# Patient Record
Sex: Female | Born: 1960 | Race: White | Hispanic: No | Marital: Single | Smoking: Never smoker
Health system: Southern US, Community
[De-identification: ages and names within clinical notes are randomized; demographics above are authoritative.]

## PROBLEM LIST (undated history)

## (undated) DIAGNOSIS — M51369 Other intervertebral disc degeneration, lumbar region without mention of lumbar back pain or lower extremity pain: Secondary | ICD-10-CM

## (undated) DIAGNOSIS — D649 Anemia, unspecified: Secondary | ICD-10-CM

## (undated) DIAGNOSIS — E079 Disorder of thyroid, unspecified: Secondary | ICD-10-CM

## (undated) DIAGNOSIS — R102 Pelvic and perineal pain unspecified side: Secondary | ICD-10-CM

## (undated) DIAGNOSIS — R319 Hematuria, unspecified: Secondary | ICD-10-CM

## (undated) DIAGNOSIS — M5126 Other intervertebral disc displacement, lumbar region: Secondary | ICD-10-CM

## (undated) DIAGNOSIS — K219 Gastro-esophageal reflux disease without esophagitis: Secondary | ICD-10-CM

## (undated) DIAGNOSIS — K660 Peritoneal adhesions (postprocedural) (postinfection): Secondary | ICD-10-CM

## (undated) DIAGNOSIS — M5136 Other intervertebral disc degeneration, lumbar region: Secondary | ICD-10-CM

## (undated) DIAGNOSIS — G4730 Sleep apnea, unspecified: Secondary | ICD-10-CM

## (undated) DIAGNOSIS — N809 Endometriosis, unspecified: Secondary | ICD-10-CM

## (undated) DIAGNOSIS — I10 Essential (primary) hypertension: Secondary | ICD-10-CM

## (undated) DIAGNOSIS — W3400XA Accidental discharge from unspecified firearms or gun, initial encounter: Secondary | ICD-10-CM

## (undated) DIAGNOSIS — Y249XXA Unspecified firearm discharge, undetermined intent, initial encounter: Secondary | ICD-10-CM

## (undated) DIAGNOSIS — N200 Calculus of kidney: Secondary | ICD-10-CM

## (undated) HISTORY — DX: Gastro-esophageal reflux disease without esophagitis: K21.9

## (undated) HISTORY — PX: EXPLORATORY LAPAROTOMY: SUR591

## (undated) HISTORY — DX: Calculus of kidney: N20.0

## (undated) HISTORY — DX: Endometriosis, unspecified: N80.9

## (undated) HISTORY — DX: Sleep apnea, unspecified: G47.30

## (undated) HISTORY — DX: Hematuria, unspecified: R31.9

## (undated) HISTORY — DX: Other intervertebral disc degeneration, lumbar region without mention of lumbar back pain or lower extremity pain: M51.369

## (undated) HISTORY — DX: Accidental discharge from unspecified firearms or gun, initial encounter: W34.00XA

## (undated) HISTORY — PX: JOINT REPLACEMENT: SHX530

## (undated) HISTORY — DX: Pelvic and perineal pain: R10.2

## (undated) HISTORY — DX: Peritoneal adhesions (postprocedural) (postinfection): K66.0

## (undated) HISTORY — DX: Other intervertebral disc degeneration, lumbar region: M51.36

## (undated) HISTORY — DX: Other intervertebral disc displacement, lumbar region: M51.26

## (undated) HISTORY — DX: Pelvic and perineal pain unspecified side: R10.20

## (undated) HISTORY — PX: CARPAL TUNNEL RELEASE: SHX101

## (undated) HISTORY — DX: Unspecified firearm discharge, undetermined intent, initial encounter: Y24.9XXA

---

## 2006-09-10 ENCOUNTER — Ambulatory Visit: Payer: Self-pay

## 2006-09-17 ENCOUNTER — Ambulatory Visit: Payer: Self-pay

## 2007-07-20 ENCOUNTER — Ambulatory Visit: Payer: Self-pay | Admitting: Family Medicine

## 2007-10-28 ENCOUNTER — Ambulatory Visit: Payer: Self-pay | Admitting: Family Medicine

## 2008-01-07 ENCOUNTER — Ambulatory Visit: Payer: Self-pay | Admitting: Family Medicine

## 2008-02-25 ENCOUNTER — Ambulatory Visit: Payer: Self-pay | Admitting: Family Medicine

## 2008-03-03 ENCOUNTER — Ambulatory Visit: Payer: Self-pay | Admitting: Family Medicine

## 2008-04-23 ENCOUNTER — Encounter: Admission: RE | Admit: 2008-04-23 | Discharge: 2008-04-23 | Payer: Self-pay | Admitting: Neurology

## 2009-05-02 ENCOUNTER — Ambulatory Visit: Payer: Self-pay | Admitting: Family Medicine

## 2009-05-28 ENCOUNTER — Emergency Department: Payer: Self-pay | Admitting: Emergency Medicine

## 2009-06-13 ENCOUNTER — Emergency Department: Payer: Self-pay | Admitting: Emergency Medicine

## 2009-06-15 ENCOUNTER — Ambulatory Visit: Payer: Self-pay | Admitting: Orthopedic Surgery

## 2009-06-16 ENCOUNTER — Ambulatory Visit: Payer: Self-pay | Admitting: Orthopedic Surgery

## 2009-08-22 ENCOUNTER — Encounter: Payer: Self-pay | Admitting: Orthopedic Surgery

## 2009-09-06 ENCOUNTER — Encounter: Payer: Self-pay | Admitting: Orthopedic Surgery

## 2009-10-07 ENCOUNTER — Encounter: Payer: Self-pay | Admitting: Orthopedic Surgery

## 2009-11-23 ENCOUNTER — Ambulatory Visit: Payer: Self-pay | Admitting: Family Medicine

## 2009-12-18 ENCOUNTER — Ambulatory Visit: Payer: Self-pay | Admitting: Orthopedic Surgery

## 2009-12-21 ENCOUNTER — Ambulatory Visit: Payer: Self-pay | Admitting: Orthopedic Surgery

## 2010-01-23 ENCOUNTER — Encounter: Payer: Self-pay | Admitting: Physician Assistant

## 2010-02-04 ENCOUNTER — Encounter: Payer: Self-pay | Admitting: Physician Assistant

## 2010-03-07 ENCOUNTER — Encounter: Payer: Self-pay | Admitting: Physician Assistant

## 2010-03-16 ENCOUNTER — Ambulatory Visit: Payer: Self-pay | Admitting: Specialist

## 2010-04-27 ENCOUNTER — Ambulatory Visit: Payer: Self-pay | Admitting: Endocrinology

## 2010-08-20 ENCOUNTER — Ambulatory Visit: Payer: Self-pay | Admitting: Family Medicine

## 2011-06-06 DIAGNOSIS — E010 Iodine-deficiency related diffuse (endemic) goiter: Secondary | ICD-10-CM | POA: Insufficient documentation

## 2011-08-06 DIAGNOSIS — G43719 Chronic migraine without aura, intractable, without status migrainosus: Secondary | ICD-10-CM | POA: Insufficient documentation

## 2011-10-17 ENCOUNTER — Ambulatory Visit: Payer: Self-pay | Admitting: Family Medicine

## 2012-06-16 DIAGNOSIS — G5600 Carpal tunnel syndrome, unspecified upper limb: Secondary | ICD-10-CM | POA: Insufficient documentation

## 2012-06-23 DIAGNOSIS — M1991 Primary osteoarthritis, unspecified site: Secondary | ICD-10-CM | POA: Insufficient documentation

## 2012-10-07 HISTORY — PX: OTHER SURGICAL HISTORY: SHX169

## 2012-12-31 DIAGNOSIS — I10 Essential (primary) hypertension: Secondary | ICD-10-CM | POA: Insufficient documentation

## 2012-12-31 DIAGNOSIS — J309 Allergic rhinitis, unspecified: Secondary | ICD-10-CM | POA: Insufficient documentation

## 2012-12-31 DIAGNOSIS — E039 Hypothyroidism, unspecified: Secondary | ICD-10-CM | POA: Insufficient documentation

## 2013-01-21 DIAGNOSIS — G4733 Obstructive sleep apnea (adult) (pediatric): Secondary | ICD-10-CM | POA: Insufficient documentation

## 2013-01-28 ENCOUNTER — Ambulatory Visit: Payer: Self-pay | Admitting: Family Medicine

## 2013-02-18 ENCOUNTER — Ambulatory Visit: Payer: Self-pay | Admitting: Family Medicine

## 2013-02-23 ENCOUNTER — Ambulatory Visit: Payer: Self-pay | Admitting: Emergency Medicine

## 2013-03-02 ENCOUNTER — Emergency Department: Payer: Self-pay | Admitting: Emergency Medicine

## 2013-03-03 DIAGNOSIS — M129 Arthropathy, unspecified: Secondary | ICD-10-CM | POA: Insufficient documentation

## 2013-03-17 ENCOUNTER — Ambulatory Visit: Payer: Self-pay | Admitting: Orthopedic Surgery

## 2013-04-05 ENCOUNTER — Ambulatory Visit: Payer: Self-pay | Admitting: Orthopedic Surgery

## 2013-04-05 LAB — CBC
HCT: 39.1 % (ref 35.0–47.0)
MCV: 89 fL (ref 80–100)
Platelet: 317 10*3/uL (ref 150–440)
RBC: 4.41 10*6/uL (ref 3.80–5.20)

## 2013-04-05 LAB — URINALYSIS, COMPLETE
Glucose,UR: NEGATIVE mg/dL (ref 0–75)
Ketone: NEGATIVE
Leukocyte Esterase: NEGATIVE

## 2013-04-05 LAB — BASIC METABOLIC PANEL
Anion Gap: 5 — ABNORMAL LOW (ref 7–16)
BUN: 12 mg/dL (ref 7–18)
Chloride: 105 mmol/L (ref 98–107)
Osmolality: 277 (ref 275–301)
Potassium: 4 mmol/L (ref 3.5–5.1)

## 2013-04-05 LAB — APTT: Activated PTT: 31.9 secs (ref 23.6–35.9)

## 2013-04-20 ENCOUNTER — Inpatient Hospital Stay: Payer: Self-pay | Admitting: Orthopedic Surgery

## 2013-04-21 LAB — HEMOGLOBIN: HGB: 11.9 g/dL — ABNORMAL LOW (ref 12.0–16.0)

## 2013-04-21 LAB — PLATELET COUNT: Platelet: 252 10*3/uL (ref 150–440)

## 2013-04-21 LAB — BASIC METABOLIC PANEL
Chloride: 109 mmol/L — ABNORMAL HIGH (ref 98–107)
Co2: 27 mmol/L (ref 21–32)
EGFR (African American): >60
Potassium: 3.6 mmol/L (ref 3.5–5.1)
Sodium: 141 mmol/L (ref 136–145)

## 2013-04-22 LAB — PATHOLOGY REPORT

## 2013-05-13 ENCOUNTER — Ambulatory Visit: Payer: Self-pay | Admitting: Orthopedic Surgery

## 2013-05-19 ENCOUNTER — Ambulatory Visit: Payer: Self-pay | Admitting: Orthopedic Surgery

## 2013-06-23 ENCOUNTER — Ambulatory Visit: Payer: Self-pay | Admitting: Family Medicine

## 2013-06-25 DIAGNOSIS — N951 Menopausal and female climacteric states: Secondary | ICD-10-CM | POA: Insufficient documentation

## 2013-07-15 DIAGNOSIS — N200 Calculus of kidney: Secondary | ICD-10-CM | POA: Insufficient documentation

## 2013-07-15 DIAGNOSIS — N3020 Other chronic cystitis without hematuria: Secondary | ICD-10-CM | POA: Insufficient documentation

## 2013-07-15 DIAGNOSIS — R3989 Other symptoms and signs involving the genitourinary system: Secondary | ICD-10-CM | POA: Insufficient documentation

## 2013-07-15 DIAGNOSIS — R339 Retention of urine, unspecified: Secondary | ICD-10-CM | POA: Insufficient documentation

## 2013-07-26 DIAGNOSIS — N3941 Urge incontinence: Secondary | ICD-10-CM | POA: Insufficient documentation

## 2013-08-06 ENCOUNTER — Ambulatory Visit: Payer: Self-pay | Admitting: Urology

## 2013-10-26 ENCOUNTER — Ambulatory Visit: Payer: Self-pay | Admitting: Obstetrics and Gynecology

## 2013-10-26 LAB — CBC
HCT: 39.6 % (ref 35.0–47.0)
HGB: 13.2 g/dL (ref 12.0–16.0)
MCH: 30.5 pg (ref 26.0–34.0)
MCHC: 33.4 g/dL (ref 32.0–36.0)
MCV: 91 fL (ref 80–100)
Platelet: 307 10*3/uL (ref 150–440)
RBC: 4.34 10*6/uL (ref 3.80–5.20)
RDW: 13.4 % (ref 11.5–14.5)
WBC: 7 10*3/uL (ref 3.6–11.0)

## 2013-10-26 LAB — BASIC METABOLIC PANEL
Anion Gap: 6 — ABNORMAL LOW (ref 7–16)
BUN: 15 mg/dL (ref 7–18)
CALCIUM: 9 mg/dL (ref 8.5–10.1)
CREATININE: 0.52 mg/dL — AB (ref 0.60–1.30)
Chloride: 105 mmol/L (ref 98–107)
Co2: 29 mmol/L (ref 21–32)
EGFR (African American): >60
EGFR (Non-African Amer.): >60
Glucose: 78 mg/dL (ref 65–99)
Osmolality: 279 (ref 275–301)
POTASSIUM: 4.2 mmol/L (ref 3.5–5.1)
SODIUM: 140 mmol/L (ref 136–145)

## 2013-11-01 ENCOUNTER — Ambulatory Visit: Payer: Self-pay | Admitting: Obstetrics and Gynecology

## 2013-11-03 LAB — PATHOLOGY REPORT

## 2013-11-26 DIAGNOSIS — N809 Endometriosis, unspecified: Secondary | ICD-10-CM | POA: Insufficient documentation

## 2014-01-14 ENCOUNTER — Ambulatory Visit: Payer: Self-pay | Admitting: Urology

## 2014-01-17 DIAGNOSIS — N23 Unspecified renal colic: Secondary | ICD-10-CM | POA: Insufficient documentation

## 2014-01-20 DIAGNOSIS — M549 Dorsalgia, unspecified: Secondary | ICD-10-CM | POA: Insufficient documentation

## 2014-01-24 DIAGNOSIS — G5139 Clonic hemifacial spasm, unspecified: Secondary | ICD-10-CM | POA: Insufficient documentation

## 2014-04-12 DIAGNOSIS — N952 Postmenopausal atrophic vaginitis: Secondary | ICD-10-CM | POA: Insufficient documentation

## 2014-05-27 ENCOUNTER — Ambulatory Visit: Payer: Self-pay | Admitting: Obstetrics and Gynecology

## 2014-05-31 ENCOUNTER — Ambulatory Visit: Payer: Self-pay | Admitting: Obstetrics and Gynecology

## 2014-05-31 LAB — BASIC METABOLIC PANEL
ANION GAP: 8 (ref 7–16)
BUN: 14 mg/dL (ref 7–18)
CO2: 29 mmol/L (ref 21–32)
Calcium, Total: 8.9 mg/dL (ref 8.5–10.1)
Chloride: 105 mmol/L (ref 98–107)
Creatinine: 0.61 mg/dL (ref 0.60–1.30)
GLUCOSE: 81 mg/dL (ref 65–99)
OSMOLALITY: 283 (ref 275–301)
POTASSIUM: 4.5 mmol/L (ref 3.5–5.1)
Sodium: 142 mmol/L (ref 136–145)

## 2014-05-31 LAB — CBC
HCT: 40.2 % (ref 35.0–47.0)
HGB: 13.6 g/dL (ref 12.0–16.0)
MCH: 31.8 pg (ref 26.0–34.0)
MCHC: 33.7 g/dL (ref 32.0–36.0)
MCV: 94 fL (ref 80–100)
Platelet: 291 10*3/uL (ref 150–440)
RBC: 4.27 10*6/uL (ref 3.80–5.20)
RDW: 12.8 % (ref 11.5–14.5)
WBC: 8.1 10*3/uL (ref 3.6–11.0)

## 2014-06-06 ENCOUNTER — Inpatient Hospital Stay: Payer: Self-pay | Admitting: Obstetrics and Gynecology

## 2014-06-06 HISTORY — PX: ABDOMINAL HYSTERECTOMY: SHX81

## 2014-06-07 LAB — HEMOGLOBIN: HGB: 11.2 g/dL — AB (ref 12.0–16.0)

## 2014-06-08 LAB — PATHOLOGY REPORT

## 2014-07-21 ENCOUNTER — Ambulatory Visit: Payer: Self-pay | Admitting: Family Medicine

## 2014-09-26 ENCOUNTER — Ambulatory Visit: Payer: Self-pay | Admitting: Orthopedic Surgery

## 2014-10-06 ENCOUNTER — Ambulatory Visit: Payer: Self-pay | Admitting: Orthopedic Surgery

## 2014-10-06 DIAGNOSIS — I10 Essential (primary) hypertension: Secondary | ICD-10-CM

## 2014-10-06 LAB — URINALYSIS, COMPLETE
BILIRUBIN, UR: NEGATIVE
BLOOD: NEGATIVE
GLUCOSE, UR: NEGATIVE mg/dL (ref 0–75)
KETONE: NEGATIVE
LEUKOCYTE ESTERASE: NEGATIVE
NITRITE: NEGATIVE
PH: 6 (ref 4.5–8.0)
Protein: NEGATIVE
RBC,UR: NONE SEEN /HPF (ref 0–5)
Specific Gravity: 1.008 (ref 1.003–1.030)
Squamous Epithelial: 1

## 2014-10-06 LAB — BASIC METABOLIC PANEL
ANION GAP: 3 — AB (ref 7–16)
BUN: 16 mg/dL (ref 7–18)
CHLORIDE: 106 mmol/L (ref 98–107)
Calcium, Total: 8.5 mg/dL (ref 8.5–10.1)
Co2: 30 mmol/L (ref 21–32)
Creatinine: 0.66 mg/dL (ref 0.60–1.30)
EGFR (Non-African Amer.): >60
GLUCOSE: 76 mg/dL (ref 65–99)
Osmolality: 277 (ref 275–301)
Potassium: 4.2 mmol/L (ref 3.5–5.1)
SODIUM: 139 mmol/L (ref 136–145)

## 2014-10-06 LAB — CBC
HCT: 39.5 % (ref 35.0–47.0)
HGB: 13 g/dL (ref 12.0–16.0)
MCH: 30.9 pg (ref 26.0–34.0)
MCHC: 32.8 g/dL (ref 32.0–36.0)
MCV: 94 fL (ref 80–100)
Platelet: 280 10*3/uL (ref 150–440)
RBC: 4.2 10*6/uL (ref 3.80–5.20)
RDW: 13.1 % (ref 11.5–14.5)
WBC: 6.1 10*3/uL (ref 3.6–11.0)

## 2014-10-06 LAB — SEDIMENTATION RATE: Erythrocyte Sed Rate: 15 mm/hr (ref 0–30)

## 2014-10-06 LAB — APTT: Activated PTT: 29.2 secs (ref 23.6–35.9)

## 2014-10-06 LAB — PROTIME-INR
INR: 1
Prothrombin Time: 12.8 secs (ref 11.5–14.7)

## 2014-10-06 LAB — MRSA PCR SCREENING

## 2014-10-11 ENCOUNTER — Inpatient Hospital Stay: Payer: Self-pay | Admitting: Orthopedic Surgery

## 2014-10-12 LAB — BASIC METABOLIC PANEL
ANION GAP: 6 — AB (ref 7–16)
BUN: 10 mg/dL (ref 7–18)
CREATININE: 0.73 mg/dL (ref 0.60–1.30)
Calcium, Total: 7.1 mg/dL — ABNORMAL LOW (ref 8.5–10.1)
Chloride: 105 mmol/L (ref 98–107)
Co2: 28 mmol/L (ref 21–32)
EGFR (Non-African Amer.): >60
GLUCOSE: 100 mg/dL — AB (ref 65–99)
OSMOLALITY: 277 (ref 275–301)
Potassium: 3.3 mmol/L — ABNORMAL LOW (ref 3.5–5.1)
SODIUM: 139 mmol/L (ref 136–145)

## 2014-10-12 LAB — HEMOGLOBIN: HGB: 10.3 g/dL — AB (ref 12.0–16.0)

## 2014-10-12 LAB — PLATELET COUNT: PLATELETS: 236 10*3/uL (ref 150–440)

## 2014-10-13 LAB — BASIC METABOLIC PANEL
Anion Gap: 7 (ref 7–16)
BUN: 6 mg/dL — ABNORMAL LOW (ref 7–18)
CHLORIDE: 103 mmol/L (ref 98–107)
CO2: 28 mmol/L (ref 21–32)
CREATININE: 0.61 mg/dL (ref 0.60–1.30)
Calcium, Total: 7.7 mg/dL — ABNORMAL LOW (ref 8.5–10.1)
EGFR (African American): >60
EGFR (Non-African Amer.): >60
Glucose: 90 mg/dL (ref 65–99)
Osmolality: 273 (ref 275–301)
POTASSIUM: 3.3 mmol/L — AB (ref 3.5–5.1)
SODIUM: 138 mmol/L (ref 136–145)

## 2014-10-13 LAB — HEMOGLOBIN: HGB: 10.1 g/dL — AB (ref 12.0–16.0)

## 2014-10-14 LAB — POTASSIUM: POTASSIUM: 4 mmol/L (ref 3.5–5.1)

## 2015-01-27 NOTE — Op Note (Signed)
PATIENT NAME:  Vanessa Miller, Vanessa Miller MR#:  [id number] DATE OF BIRTH:  [birth year]  DATE OF PROCEDURE:  05/19/2013  PREOPERATIVE DIAGNOSIS:  Right knee arthrofibrosis.  POSTOPERATIVE DIAGNOSIS:  Right knee arthrofibrosis.   PROCEDURE:  Right knee manipulation.   ANESTHESIA:  General.   SURGEON:  Laurene Footman, M.D.   DESCRIPTION OF PROCEDURE:  The patient is brought to the Operating Room and after adequate anesthesia was obtained, appropriate patient identification and timeout procedures were completed.  After this was completed, the hip was brought up into flexion and passive flexion at the start of the case was approximately 80 degrees.  Gentle pressure was applied to the proximal tibia and palpable and audible popping of adhesions was noted.  The knee was brought to approximately 120 degrees flexion and full extension.  The patient was then sent to the recovery room in stable condition.  There are no complications.  No specimen.  No blood loss.     ____________________________ Laurene Footman, MD mjm:ea D: 05/19/2013 18:54:02 ET T: 05/20/2013 03:22:21 ET JOB#: [id number]  cc: Laurene Footman, MD, <Dictator> Laurene Footman MD ELECTRONICALLY SIGNED 05/20/2013 7:19

## 2015-01-27 NOTE — Discharge Summary (Signed)
PATIENT NAME:  Vanessa Miller, Vanessa Miller MR#:  [id number] DATE OF BIRTH:  07-02-1961  DATE OF ADMISSION:  04/20/2013 DATE OF DISCHARGE:  04/23/2013  ADMITTING DIAGNOSIS: Right knee osteoarthritis.   DISCHARGE DIAGNOSIS: Right knee osteoarthritis.   PROCEDURE: Right total knee replacement.   SURGEON: Laurene Footman, MD   ASSISTANT: Rachelle Hora, PA-C   ANESTHESIA: Spinal.   TOTAL TOURNIQUET TIME: 86 minutes.   ESTIMATED BLOOD LOSS: 100 mL.   COMPLICATIONS: None.   SPECIMEN: Cut ends of bone.   IMPLANTS: Medacta GMK primary total knee system, size 3 narrow right femur standard, a 2 right fixed tibial component with a 2 17-mm standard insert and a size 1 patella. All components were cemented.   HISTORY:  The patient is a 54 year old who has had a long history of right knee pain.   She has persistent pain.  She has had prior injections bilaterally.  She is currently taking narcotic pain medication, and this does not give adequate relief. She has pain with activity as well as with her rest. She has been using a cane and still has significant pain.  She had a recent right knee CT.     PHYSICAL EXAMINATION:  RIGHT KNEE: She has 0 to 150 degrees with a correctable varus deformity.  She has medial joint and patellofemoral crepitation with mild swelling to the knee.  A small Baker's cyst is noted. Distally she has intact pulses and sensation that is intact. HEENT: Normal. HEART:  Regular rate and rhythm. LUNGS:  Clear to auscultation.   HOSPITAL COURSE: The patient was admitted to the hospital on 04/20/2013. She had surgery that same day and was brought to the orthopedic floor from the PACU unit in stable condition. On postoperative day 1, the patient had slow progress with physical therapy.  She had acute postop blood loss anemia. Her vital signs are stable. On postoperative day 2, the patient had progressed well with physical therapy.  She was doing well with her pain control. Her vital signs  are stable. On postoperative day 3, the patient was doing well, and she was ready for discharge to home with home health physical therapy.   CONDITION AT DISCHARGE: Stable.   DISCHARGE INSTRUCTIONS: 1.  The patient may gradually increase weight-bearing on the affected extremity.  2.  She is to elevate it, put her leg on 1 or 2 pillows with foot higher than the knee.  3.  She is to wear thigh-high TED hose on both legs and remove at bedtime, replace on arising the next morning.   4. use using incentive spirometry every hour while awake and encouraged cough and deep breathing.  5.  She may resume a regular diet as tolerated.  6.  Continue using Polar Care unit, maintaining temperature between 40 to 50 degrees.  7.  She is to not get the dressing or bandage wet or dirty. Call [facility], if the dressing gets water under it leaving the dressing on. Call [facility] if you experience any increased redness, swelling or drainage at the incision, fever above 101.5 degrees. 8.  Call [facility] if there is pain, nausea, weakness, malaise  or bowel or bladder symptoms.  9.  Home physical therapy has been arranged for continuation of the rehab program. She is to call [facility] if the therapist has not contacted her within 48 hours of her return home. She is to follow up with [place] in 2 weeks.   DISCHARGE MEDICATIONS: Accupril 10 mg  1 tablet orally once a day, Armour thyroid 90 mg 1 tablet orally once a day, loratadine 10 mg 1 cap orally once a day, omeprazole 40 mg 1 cap orally once a day, cranberry fruit oral capsule 1 tablet orally once a day, Centrum Silver therapeutic multiple vitamins 1 tablet orally once a day, Tylenol 500 mg 1 tablet orally every 4 hours as needed for pain or temp greater than 101.4, oxycodone 5 mg 1 to 2 tabs orally every 4 hours as needed for pain, Nucynta 100 mg 1 tablet orally every 4 hours as needed for pain, magnesium hydroxide 8% oral suspension 30 mL orally 2 times a day as needed  for constipation, Xarelto 10 mg oral tablet 1 tablet orally once a day in the morning x 9 days. Bisacodyl 10 mg rectal suppository, 1 suppository per rectum once a day as needed for constipation. Docusate senna 50 mg/8.6 mg oral tablet, 1 tablet orally 2 times a day.   ____________________________ T. Rachelle Hora, PA-C tcg:cb D: 04/22/2013 21:36:00 ET T: 04/22/2013 22:42:33 ET JOB#: [id number]  cc: T. Rachelle Hora, PA-C, <Dictator> Duanne Guess [place] ELECTRONICALLY SIGNED 05/10/2013 12:28

## 2015-01-27 NOTE — Op Note (Signed)
PATIENT NAME:  Vanessa Miller, Vanessa Miller MR#:  [id number] DATE OF BIRTH:  07/09/1961  DATE OF PROCEDURE:  04/20/2013  PREOPERATIVE DIAGNOSIS: Right knee osteoarthritis.   POSTOPERATIVE DIAGNOSIS: Right knee osteoarthritis.   PROCEDURE: Right total knee replacement.   SURGEON: Laurene Footman, MD   ASSISTANT:  Rachelle Hora, PA-C  ANESTHESIA: Spinal.   DESCRIPTION OF PROCEDURE: The patient was brought to the operating room and after adequate spinal anesthesia was obtained, the patient was placed supine on the operative table with Alvarado legholder utilized with a tourniquet applied to the upper thigh After prepping and draping in the usual sterile fashion and appropriate patient identification and timeout procedures were completed. The leg was exsanguinated with an Esmarch and the tourniquet raised after appropriate patient identification and timeout procedures were completed. A midline skin incision with the knee in flexion was made followed by medial parapatellar arthrotomy. Inspection of the knee revealed severe medial compartment arthrosis with exposed bone over most of the medial femoral condyle and tibial condyle. There was extensive wear of the patella and relative sparing of the lateral compartment. The ACL and fat pad were excised. The proximal tibia was exposed to allow for application of the Freeland cutting guide. The cutting guide was applied and the proximal tibia cut carried out preserving the PCL. The femur was approached in a similar fashion with the Loraine cutting guide applied and distal femoral cut made. The femur was a size 3 and the anterior, posterior and chamfer cuts carried out. With the residual horns of the menisci excised at this time, the tibial trial was placed with a proximal drill hole and rotation based off the pin left from the tibial cutting block and a 3 femur. It took a 17 mm spacer to get excellent stability in flexion and mid flexion with significant instability in flexion  until that thickness was obtained. Full extension was also obtained. The patella was cut using the patellar guide and sized to a size 1 after drill holes were made. At this point, all the bony surfaces were thoroughly irrigated, after all the trials had been removed. The tourniquet was let down. Hemostasis was checked with electrocautery. Exparel diluted with saline was infiltrated in the periarticular tissue with a combination of Sensorcaine, morphine and Toradol to aid in postop analgesia. The tourniquet was then again raised and the bony surfaces were irrigated and dried. The tibial component was cemented into place first with excess cement removed followed by placement of the polyethylene component. The femoral component was then placed and patellar button clamped into place. After thorough irrigation of the knee, after the cement had set, the arthrotomy was repaired using #1 Ethibond at the medial aspect to help maintain alignment and the rest of the arthrotomy closed with heavy quill, 2-0 quill subcutaneously and skin staples followed by insertion of wound VAC and Polar Care device. Tourniquet was let down prior to the close of the case.   TOTAL TOURNIQUET TIME:  86 minutes.   ESTIMATED BLOOD LOSS: 100 mL.   COMPLICATIONS: None.   SPECIMEN: Cut ends of bone.   IMPLANTS: Medacta GMK primary total knee system, size 3 narrow right femur standard, a 2 right fixed tibial component with a 2 17 mm standard insert and a size 1 patella. All components cemented.   ____________________________ Laurene Footman, MD mjm:sb D: 04/20/2013 10:32:49 ET T: 04/20/2013 11:08:50 ET JOB#: [id number]  cc: Laurene Footman, MD, <Dictator> Laurene Footman MD ELECTRONICALLY SIGNED 04/20/2013 14:21
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
## 2015-01-28 NOTE — Op Note (Signed)
PATIENT NAME:  Vanessa Miller, Vanessa Miller MR#:  [id number] DATE OF BIRTH:  [birth year]  DATE OF PROCEDURE:  06/06/2014  PREOPERATIVE DIAGNOSES:  1.  Symptomatic endometriosis.  2.  Chronic pelvic pain.  3.  Pelvic adhesive disease.   POSTOPERATIVE DIAGNOSES:  1.  Symptomatic endometriosis.  2.  Chronic pelvic pain.  3.  Pelvic adhesive disease.   PROCEDURE:  Total abdominal hysterectomy, bilateral salpingo-oophorectomy.   SURGEON:  Malachi Paradise, MD   FIRST ASSISTANT: Rubie Maid, MD   ANESTHESIA: General endotracheal.   INDICATIONS: The patient is a 54 year old white female with history of endometriosis, symptomatic, who presents for definitive surgery through TAH, BSO.  Depo-Lupron therapy did not resolve symptomatology.  Previous laparoscopy demonstrated endometriosis overlying the left infundibulopelvic ligament.   FINDINGS AT SURGERY:  Small uterus; normal right tube and ovary; left ovary and tube were stuck down to the left pelvic sidewall.   DESCRIPTION OF PROCEDURE: The patient was brought to the Operating Room where she was placed in the supine position.  General endotracheal anesthesia was induced without difficulty.  A ChloraPrep abdominal, perineal, intravaginal prep and drape was performed in standard fashion.  Foley catheter was placed and was draining clear yellow urine.  After timeout procedure was performed in standard fashion, Pfannenstiel incision was made in the abdomen. Fascia was incised transversely and extended bilaterally with Mayo scissors. The midline raphe was identified, separated and the peritoneum was entered.  O'Connor-O'Sullivan retractor was placed to help facilitate exposure.  The bowel was packed off with wet laps.  Bladder blade was placed.  Uterus was grasped with a double-tooth tenaculum.  The above-noted findings were made with significant adhesions between the left ovary and left pelvic sidewall.  The round ligaments were doubly clamped, cut, and stick  tied using 0 Vicryl suture.  The anterior and posterior leafs of the broad ligament were opened.  The infundibulopelvic ligament was isolated away from the ureter bilaterally and the structure on the right was doubly clamped and cut.  A free tie and stick tie were placed to facilitate hemostasis. The bladder flap was created over lower uterine segment through sharp dissection.  On the left side, the ovary was stuck down to the pelvic sidewall and decision was made to remove the ovary after hysterectomy was performed. The utero-ovarian ligament was doubly clamped and cut and stick tied with 0 Vicryl suture.  The anterior and posterior leafs of the broad ligament were adequately opened to skeletonize the uterine vessels.  The uterine vessels were isolated bilaterally and doubly clamped and cut.  These were stick tied with 0 Vicryl suture.  Sequentially, the cardinal broad ligament complexes were taken down through a clamping, cutting, and stick tying technique down to the level of the cervicovaginal junction.  At this point, the curved Heaney clamps were used to crossclamp the distal part of the cervix and excise the uterus and right tube and ovary from the operative field. The angles of the vagina were closed using 0 Vicryl sutures. The intervening cuff was closed with figure-of-eight sutures of 0 Vicryl.  Good hemostasis was noted.  Following the hysterectomy, the attention was turned to the left adnexa where the adhesions were encountered. The ovary was dissected free from the pelvic sidewall through sharp dissection. This structure along with the infundibulopelvic ligaments were isolated away from the ureter.  The infundibulopelvic ligament was then doubly clamped, cut, and stick tied using 0 Vicryl suture. Good hemostasis was noted.  All tissue was sent to pathology  for analysis. The pelvis was then copiously irrigated with saline and the irrigant fluid was aspirated.  All pedicles were noted to be hemostatic.   The abdomen was then closed in layers using 0 Maxon on the fascia in a simple running manner.  The subcutaneous tissues were reapproximated using 2-0 Vicryl suture.  The skin was closed with a subcuticular stitch of 4-0 Vicryl.  Dermabond glue was placed over the incision.  A Lidoderm patch was likewise placed for analgesia purposes and a pressure dressing was applied.  An x-ray had to be done because of a lost needle during the procedure.  A needle popped off the needle holder and could not be found.  X-ray was negative for retained objects.   This needle was later identified on a stool in the operating room.  The patient was then awakened, extubated and taken to the recovery room in satisfactory condition.   ESTIMATED BLOOD LOSS: 75 mL.  All instruments, needle, and sponge counts were verified as correct.  The patient did receive Ancef antibiotic prophylaxis.      ____________________________ Alanda Slim Mckenlee Mangham, MD mad:DT D: 06/06/2014 10:08:29 ET T: 06/06/2014 10:31:12 ET JOB#: [id number]  cc: Hassell Done A. Teale Goodgame, MD, <Dictator> [facility] MD ELECTRONICALLY SIGNED 06/16/2014 8:07

## 2015-02-05 NOTE — Op Note (Signed)
PATIENT NAME:  Vanessa Miller, Vanessa Miller MR#:  [id number] DATE OF BIRTH:  1961-09-24  DATE OF PROCEDURE:  10/11/2014  PREOPERATIVE DIAGNOSIS: Loose right total knee components.   POSTOPERATIVE DIAGNOSIS:  Loose right total knee components.   PROCEDURE: Revision of tibial and femoral components, right knee.   SURGEON: Laurene Footman, MD.  ASSISTANT:  Ronney Asters, PA-C.    ANESTHESIA: Spinal.   DESCRIPTION OF PROCEDURE: The patient was brought to the Operating Room and after adequate anesthesia was obtained, the right leg was prepped and draped in the usual sterile fashion with a tourniquet applied to the upper thigh. After prepping and draping in the usual sterile manner, appropriate patient identification and timeout procedures were completed.   The prior incision was utilized with the midline skin incision followed by a medial parapatellar arthrotomy. Inspection revealed moderate synovitis. After initial synovectomy had been completed to give exposure of the ends of the implants, the prior polyethylene implant was removed and there was some wear present, but nothing significant.   Next, a small osteotome was used to separate the metal from the cement on the femoral component and the femoral component was removed without difficulty and without bone loss. This was also true on the tibial side. Next, the cement removal equipment was used to remove the plug and cement from the canal of the tibia.  At this point, the hand reaming of the tibia was carried out up to a size 11 and a proximal tibial cut carried out. The tibia was sized to a size 2 and pinned in place.  Proximal reaming carried out followed by keel punch with a 10 mm augment.  This implant was left in place with a 10 mm rod as the 11 mm tended to tip the component into malalignment.  With tibial side done, the distal femur was opened, sequentially hand reamed and a distal femoral cut carried out, resecting approximately 4 mm getting fresh  bone. The distal femur sized to a size 3.  Anterior and posterior chamfer cuts were made, followed by the box for the PS implants.  With the PS cut completed. Trial components were placed with a 14 mm 105 femoral stem.  With these in place, a 12 mm insert gave excellent stability in mid flexion and extension with normal patellofemoral tracking.  The patella was examined at this time and it was appropriately fixed with no evidence of significant wear consistent with preoperative findings of loosening of the femoral and tibial components, but not of the patella.    At this point, the trial components were all removed and the tourniquet raised. The bony surfaces thoroughly irrigated and dried. Exparel in a dilute solution was infiltrated around the knee to aid in postoperative analgesia. When the cement was ready, this was packed into the proximal tibia. The tibial component was cemented into place first. The femoral component was then placed followed by the tibial trial. The knee was held in extension until the cement had set and then excess cement was removed. The polyethylene implant was then placed.  The initial polyethylene did not lock and so was discarded. A second implant snapped into place and fit well with set screw tightened with the torque wrench. The tourniquet was let down and the knee thoroughly irrigated. The arthrotomy was repaired using 0 Ethibond along the patella and then a heavy Quill suture, 2-0 Quill subcutaneously and skin staples. Xeroform, 4 x 4's, ABD's, Webril and Ace wrap applied with a drain placed  prior to closure with a lateral Hemovac.  Polar Care device and Ace then applied. The patient was sent to the recovery room in stable condition.   ESTIMATED BLOOD LOSS: 300 mL.   COMPLICATIONS: None.   SPECIMEN: None.   TOURNIQUET TIME: 56 minutes at 300 mmHg.    IMPLANTS: Medacta GMK revision system with a size 3 right PS femoral component, 14 mm x 105 mm stem with 4 mm distal  augments, a size 2 right fixed tibial implant with medial and lateral 10 mm wedges and a 10 x 105 mm length stem extension with gentamicin bone cement being utilized.    ____________________________ Laurene Footman, MD mjm:by D: 10/11/2014 17:46:22 ET T: 10/11/2014 22:11:52 ET JOB#: [id number]  cc: Laurene Footman, MD, <Dictator> Laurene Footman MD ELECTRONICALLY SIGNED 10/12/2014 8:29

## 2015-02-05 NOTE — Discharge Summary (Signed)
PATIENT NAME:  Vanessa, Miller MR#:  [id number] DATE OF BIRTH:  1960/12/28  DATE OF ADMISSION:  10/11/2014 DATE OF DISCHARGE:  10/14/2014  ADMITTING DIAGNOSIS: Loose right total knee components.  DISCHARGE DIAGNOSES: Loose right total knee components.   PROCEDURE: Revision of tibial and femoral components, right knee.   SURGEON: Laurene Footman, M.D.   ASSISTANT: Ronney Asters, PA-C.   ANESTHESIA: Spinal.   IMPLANTS: Medacta GMK revision system with a size 3 right PS femoral component, 14 mm x 105 mm stem with 4 mm distal augments, a size 2 right fixed tibial implant with medial and lateral 10 mm wedges, and a 10 x 105 mm length stem extension with gentamicin bone cement being utilized.   HISTORY: The patient is a 54 year old female who is seen for follow-up for right total knee complaint of pain. The original procedure was performed in 2014. She did not have any wound healing problems. She did have a pain-free period after the replacement. Symptoms started several months ago. Symptoms were gradual in onset. She reports no new injury or accident. She reports 5/10 pain. The pain is located diffuse and medial. The pain is described as aching. The symptoms are aggravated with normal daily activities, bending and using stairs. She has no mechanical symptoms. She has associated swelling. Bone scan was consistent with loosening of femoral and tibial components.   PHYSICAL EXAMINATION: GENERAL: Well-developed, well-nourished female in no apparent distress. Normal affect. Antalgic gait.  LUNGS: Clear to auscultation.  HEART: Regular rate and rhythm.  HEENT: Normal.  RIGHT LOWER EXTREMITY: Mild swelling with mild effusion with no warmth or erythema. The incision site was healed. She has tenderness along the medial joint line and range of motion was -5 to 115 degrees. Strength was normal. Alignment was good. There was good stability. NEUROLOGICAL: Normal.   HOSPITAL COURSE: The patient was  admitted to the hospital on October 11, 2014. She had surgery that same day and was brought to the orthopedic floor from the PACU in stable condition. On October 12, 2014, postop day 1, the patient was doing well. Pain was moderate. Potassium was down to 3.3 and hemoglobin was down to 10.3. The patient was given Klor-Con potassium supplementation. Vital signs were stable. On postop day 2, October 13, 2014, potassium was stable at 3.3 and hemoglobin dropped minimally at 10.1. The patient was doing well. Vital signs were stable. She was asymptomatic. Pain has improved significantly. Tramadol had been added to her pain regimen. On postop day 3, October 14, 2014, potassium was up to 4. Vital signs were stable. Pain was well controlled and she had made significant progress with physical therapy. The patient did have a bowel movement, and she was stable and ready for discharge to home with home health PT.  DISCHARGE INSTRUCTIONS: The patient can increase weight-bearing on the effected extremity. Elevate the effected foot or leg on 1 or 2 pillows with the foot higher than the knee. Thigh-high TED hose on both legs and remove 1 hour per 8 hour shift. Elevate the heels off the bed. Use incentive spirometer every hour while awake. Encourage cough and deep breathing. The patient may resume a regular diet as tolerated. Continue using Polar Care unit maintaining a temp between 40 and 50 degrees. Do not get the dressing or bandage wet or dirty. Call [facility] ortho if the dressing gets water under it. Leave the dressing on. Call [facility] ortho if any of the following occur: Bright red bleeding  from the incision wound, fever above 101.5 degrees, redness, swelling, or drainage at the incision. Call [facility] ortho if you experience any increased leg pain, numbness or weakness in your legs or bowel or bladder symptoms. Home physical therapy has been arranged for continuation of rehab program. Please call [place] if a therapist has not contacted you within 48 hours of your return home. Please follow up with [facility] ortho in 2 weeks.   DISCHARGE MEDICATIONS: Please see list of discharge medications on discharge instructions.  ____________________________ T. Rachelle Hora, PA-C tcg:sb D: 10/14/2014 08:07:31 ET T: 10/14/2014 10:40:08 ET JOB#: [id number]  cc: T. Rachelle Hora, PA-C, <Dictator> Duanne Guess [place] ELECTRONICALLY SIGNED 10/18/2014 8:31

## 2015-04-02 ENCOUNTER — Encounter: Payer: Self-pay | Admitting: Medical Oncology

## 2015-04-02 ENCOUNTER — Emergency Department
Admission: EM | Admit: 2015-04-02 | Discharge: 2015-04-02 | Disposition: A | Discharge status: Home / Self Care | Payer: No Typology Code available for payment source | Attending: Emergency Medicine | Admitting: Emergency Medicine

## 2015-04-02 DIAGNOSIS — Y9289 Other specified places as the place of occurrence of the external cause: Secondary | ICD-10-CM | POA: Insufficient documentation

## 2015-04-02 DIAGNOSIS — R51 Headache: Secondary | ICD-10-CM | POA: Diagnosis not present

## 2015-04-02 DIAGNOSIS — Y9389 Activity, other specified: Secondary | ICD-10-CM | POA: Insufficient documentation

## 2015-04-02 DIAGNOSIS — T148 Other injury of unspecified body region: Secondary | ICD-10-CM | POA: Insufficient documentation

## 2015-04-02 DIAGNOSIS — Y998 Other external cause status: Secondary | ICD-10-CM | POA: Diagnosis not present

## 2015-04-02 DIAGNOSIS — M542 Cervicalgia: Secondary | ICD-10-CM | POA: Diagnosis not present

## 2015-04-02 DIAGNOSIS — Z87440 Personal history of urinary (tract) infections: Secondary | ICD-10-CM | POA: Insufficient documentation

## 2015-04-02 DIAGNOSIS — W57XXXA Bitten or stung by nonvenomous insect and other nonvenomous arthropods, initial encounter: Secondary | ICD-10-CM | POA: Insufficient documentation

## 2015-04-02 DIAGNOSIS — I10 Essential (primary) hypertension: Secondary | ICD-10-CM | POA: Diagnosis not present

## 2015-04-02 DIAGNOSIS — M791 Myalgia, unspecified site: Secondary | ICD-10-CM

## 2015-04-02 DIAGNOSIS — M1990 Unspecified osteoarthritis, unspecified site: Secondary | ICD-10-CM | POA: Diagnosis not present

## 2015-04-02 DIAGNOSIS — M549 Dorsalgia, unspecified: Secondary | ICD-10-CM | POA: Diagnosis present

## 2015-04-02 DIAGNOSIS — R61 Generalized hyperhidrosis: Secondary | ICD-10-CM | POA: Diagnosis not present

## 2015-04-02 HISTORY — DX: Disorder of thyroid, unspecified: E07.9

## 2015-04-02 HISTORY — DX: Essential (primary) hypertension: I10

## 2015-04-02 HISTORY — DX: Anemia, unspecified: D64.9

## 2015-04-02 LAB — URINALYSIS COMPLETE WITH MICROSCOPIC (ARMC ONLY)
BILIRUBIN URINE: NEGATIVE
GLUCOSE, UA: NEGATIVE mg/dL
Hgb urine dipstick: NEGATIVE
Ketones, ur: NEGATIVE mg/dL
Leukocytes, UA: NEGATIVE
NITRITE: NEGATIVE
PH: 6 (ref 5.0–8.0)
Protein, ur: NEGATIVE mg/dL
SPECIFIC GRAVITY, URINE: 1.02 (ref 1.005–1.030)

## 2015-04-02 MED ORDER — DOXYCYCLINE HYCLATE 100 MG PO TABS
ORAL_TABLET | ORAL | Status: AC
  Filled 2015-04-02: qty 1

## 2015-04-02 MED ORDER — DIAZEPAM 5 MG PO TABS: 5.0000 mg | ORAL_TABLET | Freq: Four times a day (QID) | ORAL | Status: DC | PRN

## 2015-04-02 MED ORDER — DOXYCYCLINE HYCLATE 100 MG PO TABS
100.0000 mg | ORAL_TABLET | Freq: Once | ORAL | Status: AC
  Administered 2015-04-02: 100 mg via ORAL

## 2015-04-02 MED ORDER — DOXYCYCLINE HYCLATE 100 MG PO TABS: 100.0000 mg | ORAL_TABLET | Freq: Two times a day (BID) | ORAL | Status: DC

## 2015-04-02 NOTE — ED Provider Notes (Signed)
[facility] Emergency Department Provider Note  ____________________________________________  Time seen: Approximately 230 PM  I have reviewed the triage vital signs and the nursing notes.   HISTORY  Chief Complaint Back Pain; Insect Bite; and Headache    HPI Vanessa Miller is a 54 y.o. female with a history of joint pain since this past March who presents with worsening body aches as well as headache and neck pain over the past day. The patient said that yesterday she began having pain posteriorly in her head as well as down the back of her neck and into her back. She has had ongoing joint aches for the past several months and has been worked up by her primary care physician. She has had inflammatory markers such as CRP and ESR drawn which were normal in March. She is also had Lyme titers drawn with an equivocal ig.  The rest of the results on the Lyme panel were negative. She said that she has had 3 tick bites this past year. Has not been treated for Lyme. Denies any fever but says that she did have a night sweat last night. No vomiting. Says she does have some urinary urgency but no burning upon urination. No history of injections in the spine or IV drug use. Has had tattoos in the past but says were all done professionally. Denies any rash.The pain in her neck is aching and worsened with movement. She took one Norco yesterday and a Mobic this morning without relief. Headache had a gradual onset yesterday which gradually increased in intensity.   Past Medical History  Diagnosis Date  . Anemia   . Thyroid disease   . Hypertension     There are no active problems to display for this patient.   Past Surgical History  Procedure Laterality Date  . Joint replacement      No current outpatient prescriptions on file.  Allergies Bactrim and Macrobid  No family history on file.  Social History History  Substance Use Topics  . Smoking status: Never  Smoker   . Smokeless tobacco: Not on file  . Alcohol Use: Yes     Comment: occ    Review of Systems Constitutional: No fever/chills Eyes: No visual changes. ENT: No sore throat. Cardiovascular: Denies chest pain. Respiratory: Denies shortness of breath. Gastrointestinal: No abdominal pain.   no vomiting.  No diarrhea.  No constipation. Genitourinary: Urgency, says past history of UTI Musculoskeletal: Upper back pain across the bilateral arrest sick region. Skin: Negative for rash. Neurological: Posterior left headache. Denies focal weakness or numbness.  10-point ROS otherwise negative.  ____________________________________________   PHYSICAL EXAM:  VITAL SIGNS: ED Triage Vitals  Enc Vitals Group     BP 04/02/15 1339 138/70 mmHg     Pulse Rate 04/02/15 1339 69     Resp 04/02/15 1339 18     Temp 04/02/15 1339 98.2 F (36.8 C)     Temp Source 04/02/15 1339 Oral     SpO2 04/02/15 1339 99 %     Weight 04/02/15 1339 208 lb (94.348 kg)     Height 04/02/15 1339 5' 3"  (1.6 m)     Head Cir --      Peak Flow --      Pain Score 04/02/15 1340 8     Pain Loc --      Pain Edu? --      Excl. in [place]? --     Constitutional: Alert and oriented. Well appearing  and in no acute distress. Eyes: Conjunctivae are normal. PERRL. EOMI. Head: Atraumatic. Tenderness to palpation over the site that she says hurts to her left occiput. There is no mass or bogginess or bruising. Nose: No congestion/rhinnorhea. Mouth/Throat: Mucous membranes are moist.  Oropharynx non-erythematous. Neck: No stridor.  Tender to palpation midline as well as bilaterally. There is a negative Kernig and Brudzinski sign. The patient is able to range her neck fully. Cardiovascular: Normal rate, regular rhythm. Grossly normal heart sounds.  Good peripheral circulation. Respiratory: Normal respiratory effort.  No retractions. Lungs CTAB. Gastrointestinal: Soft and nontender. No distention. No abdominal bruits. No CVA  tenderness. Musculoskeletal: No lower extremity tenderness nor edema.  No joint effusions. Neurologic:  Normal speech and language. No gross focal neurologic deficits are appreciated. Speech is normal. No gait instability. Skin:  Skin is warm, dry and intact. No rash noted. Psychiatric: Mood and affect are normal. Speech and behavior are normal.  ____________________________________________   LABS (all labs ordered are listed, but only abnormal results are displayed)  Labs Reviewed  URINALYSIS COMPLETEWITH MICROSCOPIC ([place]) - Abnormal; Notable for the following:    Color, Urine YELLOW (*)    APPearance HAZY (*)    Bacteria, UA RARE (*)    Squamous Epithelial / LPF 6-30 (*)    All other components within normal limits   ____________________________________________  EKG   ____________________________________________  RADIOLOGY   ____________________________________________   PROCEDURES   ____________________________________________   INITIAL IMPRESSION / ASSESSMENT AND PLAN / ED COURSE  Pertinent labs & imaging results that were available during my care of the patient were reviewed by me and considered in my medical decision making (see chart for details).  ----------------------------------------- 3:59 PM on 04/02/2015 -----------------------------------------  Patient resting comfortably. Feel that her recent head and neck complaints are likely related to her overall joint pain that she has been experiencing over the past several months. She has not been treated for Lyme or RMSF despite her multiple tick bites. I will treat her empirically as well as discharge with Valium as a muscle relaxer to be used as needed. Furthermore, I discussed lumbar puncture which she does not one at this time. The patient is well-appearing and without fever and able to range the neck fully. I do not feel that she has meningitis at this time. She'll be discharged home to follow up  with her primary care doctor. Nose to return if worsening symptoms especially worsening headache or development of fever. Unlikely to be subarachnoid given gradual onset of headache. ____________________________________________   FINAL CLINICAL IMPRESSION(S) / ED DIAGNOSES  Acute joint pains. Acute headache. Initial visit.    Orbie Pyo, MD 04/02/15 [phone]

## 2015-04-02 NOTE — ED Notes (Signed)
Patient discharged after verbalizing understanding of d/c instructions given. Patient understands need for follow up with pcp.

## 2015-04-02 NOTE — ED Notes (Signed)
Pt ambulatory to triage with reports that she was bitten by a tick a few weeks ago and since then her MD has been working her up for lymes disease. Pt reports last night she began having a headache with neck stiffness and back pain.

## 2015-05-02 DIAGNOSIS — M2550 Pain in unspecified joint: Secondary | ICD-10-CM | POA: Insufficient documentation

## 2015-05-17 DIAGNOSIS — Z96659 Presence of unspecified artificial knee joint: Secondary | ICD-10-CM | POA: Insufficient documentation

## 2015-05-30 ENCOUNTER — Other Ambulatory Visit: Payer: Self-pay | Admitting: Family Medicine

## 2015-05-30 ENCOUNTER — Ambulatory Visit
Admission: RE | Admit: 2015-05-30 | Discharge: 2015-05-30 | Disposition: A | Discharge status: Home / Self Care | Payer: No Typology Code available for payment source | Source: Ambulatory Visit | Attending: Family Medicine | Admitting: Family Medicine

## 2015-05-30 DIAGNOSIS — N200 Calculus of kidney: Secondary | ICD-10-CM | POA: Diagnosis not present

## 2015-05-30 DIAGNOSIS — R1031 Right lower quadrant pain: Secondary | ICD-10-CM | POA: Diagnosis present

## 2015-05-30 MED ORDER — IOHEXOL 300 MG/ML  SOLN
100.0000 mL | Freq: Once | INTRAMUSCULAR | Status: AC | PRN
  Administered 2015-05-30: 100 mL via INTRAVENOUS

## 2015-07-25 ENCOUNTER — Ambulatory Visit: Payer: Self-pay | Admitting: Obstetrics and Gynecology

## 2015-07-27 ENCOUNTER — Other Ambulatory Visit: Payer: Self-pay | Admitting: Family Medicine

## 2015-07-27 DIAGNOSIS — M5412 Radiculopathy, cervical region: Secondary | ICD-10-CM

## 2015-08-01 ENCOUNTER — Ambulatory Visit: Payer: Self-pay | Admitting: Obstetrics and Gynecology

## 2015-08-01 ENCOUNTER — Encounter: Payer: Self-pay | Admitting: Obstetrics and Gynecology

## 2015-08-01 ENCOUNTER — Ambulatory Visit (INDEPENDENT_AMBULATORY_CARE_PROVIDER_SITE_OTHER): Payer: No Typology Code available for payment source | Admitting: Obstetrics and Gynecology

## 2015-08-01 ENCOUNTER — Other Ambulatory Visit: Payer: Self-pay | Admitting: Family Medicine

## 2015-08-01 VITALS — BP 101/67 | HR 102 | Ht 63.0 in | Wt 217.7 lb

## 2015-08-01 DIAGNOSIS — N958 Other specified menopausal and perimenopausal disorders: Secondary | ICD-10-CM

## 2015-08-01 DIAGNOSIS — Z0000 Encounter for general adult medical examination without abnormal findings: Secondary | ICD-10-CM

## 2015-08-01 DIAGNOSIS — Z1211 Encounter for screening for malignant neoplasm of colon: Secondary | ICD-10-CM

## 2015-08-01 DIAGNOSIS — E8940 Asymptomatic postprocedural ovarian failure: Secondary | ICD-10-CM

## 2015-08-01 DIAGNOSIS — E669 Obesity, unspecified: Secondary | ICD-10-CM

## 2015-08-01 DIAGNOSIS — Z01419 Encounter for gynecological examination (general) (routine) without abnormal findings: Secondary | ICD-10-CM

## 2015-08-01 DIAGNOSIS — Z1231 Encounter for screening mammogram for malignant neoplasm of breast: Secondary | ICD-10-CM

## 2015-08-01 MED ORDER — ESTRADIOL 1 MG PO TABS: 1.0000 mg | ORAL_TABLET | Freq: Every day | ORAL | Status: DC
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
## 2015-08-01 NOTE — Patient Instructions (Signed)
1.  No Pap smear. 2.  Mammogram is scheduled this year. 3.  Stool guaiac testing has already been completed this year. 4.  Routine screening lab work per primary care. 5.  Weight loss through healthy eating and exercise encouraged. 6.  Refill estradiol. 7.  Encourage calcium and vitamin D supplementation. 8.  Return in 1 year

## 2015-08-02 ENCOUNTER — Ambulatory Visit
Admission: RE | Admit: 2015-08-02 | Discharge: 2015-08-02 | Disposition: A | Discharge status: Home / Self Care | Payer: No Typology Code available for payment source | Source: Ambulatory Visit | Attending: Family Medicine | Admitting: Family Medicine

## 2015-08-02 DIAGNOSIS — M50222 Other cervical disc displacement at C5-C6 level: Secondary | ICD-10-CM | POA: Insufficient documentation

## 2015-08-02 DIAGNOSIS — M5412 Radiculopathy, cervical region: Secondary | ICD-10-CM | POA: Diagnosis present

## 2015-08-02 DIAGNOSIS — M50223 Other cervical disc displacement at C6-C7 level: Secondary | ICD-10-CM | POA: Insufficient documentation

## 2015-08-02 DIAGNOSIS — M4802 Spinal stenosis, cervical region: Secondary | ICD-10-CM | POA: Insufficient documentation

## 2015-08-04 ENCOUNTER — Ambulatory Visit
Admission: RE | Admit: 2015-08-04 | Discharge: 2015-08-04 | Disposition: A | Discharge status: Home / Self Care | Payer: No Typology Code available for payment source | Source: Ambulatory Visit | Attending: Family Medicine | Admitting: Family Medicine

## 2015-08-04 DIAGNOSIS — Z1231 Encounter for screening mammogram for malignant neoplasm of breast: Secondary | ICD-10-CM | POA: Insufficient documentation

## 2016-08-08 ENCOUNTER — Telehealth: Payer: Self-pay | Admitting: Gastroenterology

## 2016-08-08 NOTE — Telephone Encounter (Signed)
Referred by Dr. Duke Salvia for EGD for GERD. preop bariatric surgery

## 2016-08-08 NOTE — Telephone Encounter (Signed)
Appointment has been scheduled.

## 2016-08-10 ENCOUNTER — Other Ambulatory Visit: Payer: Self-pay | Admitting: Obstetrics and Gynecology

## 2016-08-12 ENCOUNTER — Other Ambulatory Visit: Payer: Self-pay

## 2016-08-13 ENCOUNTER — Ambulatory Visit (INDEPENDENT_AMBULATORY_CARE_PROVIDER_SITE_OTHER): Payer: BLUE CROSS/BLUE SHIELD | Admitting: Gastroenterology

## 2016-08-13 ENCOUNTER — Encounter: Payer: Self-pay | Admitting: Gastroenterology

## 2016-08-13 ENCOUNTER — Other Ambulatory Visit: Payer: Self-pay
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
## 2016-08-13 NOTE — Patient Instructions (Signed)
I have scheduled you upper endoscopy for 08/23/2016. Please refer to the Instruction paper I gave you Your follow up appointment is printed below.  Please call us if you have any questions or concerns.

## 2016-08-23 ENCOUNTER — Ambulatory Visit
Admission: RE | Admit: 2016-08-23 | Discharge: 2016-08-23 | Disposition: A | Discharge status: Home / Self Care | Payer: BLUE CROSS/BLUE SHIELD | Source: Ambulatory Visit | Attending: Gastroenterology | Admitting: Gastroenterology

## 2016-08-23 ENCOUNTER — Ambulatory Visit: Payer: BLUE CROSS/BLUE SHIELD | Admitting: Anesthesiology

## 2016-08-23 ENCOUNTER — Encounter: Payer: Self-pay | Admitting: *Deleted

## 2016-08-23 ENCOUNTER — Encounter
Admission: RE | Disposition: A | Discharge status: Home / Self Care | Payer: Self-pay | Source: Ambulatory Visit | Attending: Gastroenterology

## 2016-08-23 DIAGNOSIS — Z6841 Body Mass Index (BMI) 40.0 and over, adult: Secondary | ICD-10-CM | POA: Diagnosis not present

## 2016-08-23 DIAGNOSIS — K219 Gastro-esophageal reflux disease without esophagitis: Secondary | ICD-10-CM | POA: Diagnosis not present

## 2016-08-23 DIAGNOSIS — I10 Essential (primary) hypertension: Secondary | ICD-10-CM | POA: Insufficient documentation

## 2016-08-23 DIAGNOSIS — K2970 Gastritis, unspecified, without bleeding: Secondary | ICD-10-CM | POA: Insufficient documentation

## 2016-08-23 DIAGNOSIS — E039 Hypothyroidism, unspecified: Secondary | ICD-10-CM | POA: Diagnosis not present

## 2016-08-23 DIAGNOSIS — Z79899 Other long term (current) drug therapy: Secondary | ICD-10-CM | POA: Insufficient documentation

## 2016-08-23 DIAGNOSIS — Z01818 Encounter for other preprocedural examination: Secondary | ICD-10-CM | POA: Diagnosis not present

## 2016-08-23 DIAGNOSIS — K449 Diaphragmatic hernia without obstruction or gangrene: Secondary | ICD-10-CM

## 2016-08-23 DIAGNOSIS — G4730 Sleep apnea, unspecified: Secondary | ICD-10-CM | POA: Insufficient documentation

## 2016-08-23 DIAGNOSIS — Z96651 Presence of right artificial knee joint: Secondary | ICD-10-CM | POA: Insufficient documentation

## 2016-08-23 HISTORY — PX: ESOPHAGOGASTRODUODENOSCOPY (EGD) WITH PROPOFOL: SHX5813

## 2016-08-23 SURGERY — ESOPHAGOGASTRODUODENOSCOPY (EGD) WITH PROPOFOL
Anesthesia: General

## 2016-08-23 MED ORDER — PROPOFOL 500 MG/50ML IV EMUL
INTRAVENOUS | Status: DC | PRN
  Administered 2016-08-23: 125 ug/kg/min via INTRAVENOUS

## 2016-08-23 MED ORDER — FENTANYL CITRATE (PF) 100 MCG/2ML IJ SOLN
INTRAMUSCULAR | Status: DC | PRN
  Administered 2016-08-23: 50 ug via INTRAVENOUS

## 2016-08-23 MED ORDER — MIDAZOLAM HCL 2 MG/2ML IJ SOLN
INTRAMUSCULAR | Status: DC | PRN
  Administered 2016-08-23: 1 mg via INTRAVENOUS

## 2016-08-23 MED ORDER — PROPOFOL 10 MG/ML IV BOLUS
INTRAVENOUS | Status: DC | PRN
  Administered 2016-08-23: 80 mg via INTRAVENOUS

## 2016-08-23 MED ORDER — SODIUM CHLORIDE 0.9 % IV SOLN
INTRAVENOUS | Status: DC
  Administered 2016-08-23 (×2): via INTRAVENOUS

## 2016-08-23 NOTE — Transfer of Care (Signed)
Immediate Anesthesia Transfer of Care Note  Patient: Vanessa Miller  Procedure(s) Performed: Procedure(s): ESOPHAGOGASTRODUODENOSCOPY (EGD) WITH PROPOFOL (N/A)  Patient Location: PACU  Anesthesia Type:General  Level of Consciousness: awake and sedated  Airway & Oxygen Therapy: Patient Spontanous Breathing and Patient connected to nasal cannula oxygen  Post-op Assessment: Report given to RN and Post -op Vital signs reviewed and stable  Post vital signs: Reviewed and stable  Last Vitals:  Vitals:   08/23/16 0928 08/23/16 1038  BP: (!) 152/100 108/68  Pulse: 81 76  Resp: 17 16  Temp: 36.2 C 36.3 C    Last Pain:  Vitals:   08/23/16 0928  TempSrc: Tympanic         Complications: No apparent anesthesia complications
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
## 2016-08-23 NOTE — Anesthesia Preprocedure Evaluation (Signed)
Anesthesia Evaluation  Patient identified by MRN, date of birth, ID band Patient awake    Reviewed: Allergy & Precautions, NPO status , Patient's Chart, lab work & pertinent test results  History of Anesthesia Complications Negative for: history of anesthetic complications  Airway Mallampati: III       Dental   Pulmonary neg pulmonary ROS, sleep apnea (pt states that she just had the workup and it was negative) ,           Cardiovascular hypertension, Pt. on medications      Neuro/Psych    GI/Hepatic Neg liver ROS, GERD  Medicated,  Endo/Other  Hypothyroidism Morbid obesity  Renal/GU Renal disease (stones)     Musculoskeletal   Abdominal   Peds  Hematology  (+) anemia ,   Anesthesia Other Findings   Reproductive/Obstetrics                             Anesthesia Physical Anesthesia Plan  ASA: III  Anesthesia Plan: General   Post-op Pain Management:    Induction: Intravenous  Airway Management Planned: Nasal Cannula  Additional Equipment:   Intra-op Plan:   Post-operative Plan:   Informed Consent: I have reviewed the patients History and Physical, chart, labs and discussed the procedure including the risks, benefits and alternatives for the proposed anesthesia with the patient or authorized representative who has indicated his/her understanding and acceptance.     Plan Discussed with:   Anesthesia Plan Comments:         Anesthesia Quick Evaluation

## 2016-08-23 NOTE — Anesthesia Postprocedure Evaluation (Signed)
Anesthesia Post Note  Patient: Vanessa Miller  Procedure(s) Performed: Procedure(s) (LRB): ESOPHAGOGASTRODUODENOSCOPY (EGD) WITH PROPOFOL (N/A)  Patient location during evaluation: Endoscopy Anesthesia Type: General Level of consciousness: awake and alert Pain management: pain level controlled Vital Signs Assessment: post-procedure vital signs reviewed and stable Respiratory status: spontaneous breathing and respiratory function stable Cardiovascular status: stable Anesthetic complications: no    Last Vitals:  Vitals:   08/23/16 0928 08/23/16 1038  BP: (!) 152/100 108/68  Pulse: 81 76  Resp: 17 16  Temp: 36.2 C 36.3 C    Last Pain:  Vitals:   08/23/16 0928  TempSrc: Tympanic                 KEPHART,WILLIAM K

## 2016-08-23 NOTE — Op Note (Signed)
[facility] Gastroenterology Patient Name: Vanessa Miller Procedure Date: 08/23/2016 10:21 AM MRN: [id number] Account #: [id number] Date of Birth: 07/07/1961 Admit Type: Outpatient Age: 56 Room: [facility] ENDO ROOM 4 Gender: Female Note Status: Finalized Procedure:            Upper GI endoscopy Indications:          Preoperative assessment for bariatric surgery to treat                        morbid obesity Providers:            Jonathon Bellows MD, MD Referring MD:         Domingo Pulse. Frederico Hamman (Referring MD) Medicines:            Monitored Anesthesia Care Complications:        No immediate complications. Procedure:            Pre-Anesthesia Assessment:                       - Prior to the procedure, a History and Physical was                        performed, and patient medications, allergies and                        sensitivities were reviewed. The patient's tolerance of                        previous anesthesia was reviewed.                       - The risks and benefits of the procedure and the                        sedation options and risks were discussed with the                        patient. All questions were answered and informed                        consent was obtained.                       - ASA Grade Assessment: III - A patient with severe                        systemic disease.                       After obtaining informed consent, the endoscope was                        passed under direct vision. Throughout the procedure,                        the patient's blood pressure, pulse, and oxygen                        saturations were monitored continuously. The Endoscope  was introduced through the mouth, and advanced to the                        third part of duodenum. The upper GI endoscopy was                        accomplished with ease. The patient tolerated the                        procedure well. Findings:      The  esophagus was normal.      The examined duodenum was normal.      A [Size] hiatal hernia was present. The Z line and upper margin of       gastric folds was at 35 cm mark from the incisors , the diagphragm was       at 38 cm mark.      The stomach was normal.      No bile was seen in the stomach, Biopsies were taken with a cold forceps       for histology. Impression:           - Normal esophagus.                       - Normal examined duodenum.                       - [Size] hiatal hernia.                       - Normal stomach. Recommendation:       - Patient has a contact number available for                        emergencies. The signs and symptoms of potential                        delayed complications were discussed with the patient.                        Return to normal activities tomorrow. Written discharge                        instructions were provided to the patient.                       - Resume previous diet.                       - Return to referring physician. Procedure Code(s):    --- Professional ---                       [id number], Esophagogastroduodenoscopy, flexible, transoral;                        with biopsy, single or multiple Diagnosis Code(s):    --- Professional ---                       K44.9, Diaphragmatic hernia without obstruction or  gangrene                       Z01.818, Encounter for other preprocedural examination                       E66.01, Morbid (severe) obesity due to excess calories CPT copyright 2016 [employer]. All rights reserved. The codes documented in this report are preliminary and upon coder review may  be revised to meet current compliance requirements. Jonathon Bellows, MD Jonathon Bellows MD, MD 08/23/2016 10:35:20 AM This report has been signed electronically. Number of Addenda: 0 Note Initiated On: 08/23/2016 10:21 AM      [facility]

## 2016-08-26 ENCOUNTER — Encounter: Payer: Self-pay | Admitting: Gastroenterology

## 2016-08-26 LAB — SURGICAL PATHOLOGY

## 2016-09-02 ENCOUNTER — Ambulatory Visit: Payer: BLUE CROSS/BLUE SHIELD | Admitting: Gastroenterology

## 2016-10-08 ENCOUNTER — Ambulatory Visit: Payer: No Typology Code available for payment source | Admitting: Cardiology

## 2017-01-20 ENCOUNTER — Other Ambulatory Visit: Payer: Self-pay | Admitting: Family Medicine

## 2017-01-21 ENCOUNTER — Other Ambulatory Visit: Payer: Self-pay | Admitting: Nurse Practitioner

## 2017-01-21 DIAGNOSIS — Z1231 Encounter for screening mammogram for malignant neoplasm of breast: Secondary | ICD-10-CM

## 2017-02-12 ENCOUNTER — Ambulatory Visit
Admission: RE | Admit: 2017-02-12 | Discharge: 2017-02-12 | Disposition: A | Discharge status: Home / Self Care | Payer: BLUE CROSS/BLUE SHIELD | Source: Ambulatory Visit | Attending: Nurse Practitioner | Admitting: Nurse Practitioner

## 2017-02-12 DIAGNOSIS — Z1231 Encounter for screening mammogram for malignant neoplasm of breast: Secondary | ICD-10-CM | POA: Diagnosis not present

## 2017-02-14 ENCOUNTER — Other Ambulatory Visit: Payer: Self-pay | Admitting: Nurse Practitioner

## 2017-02-14 DIAGNOSIS — R928 Other abnormal and inconclusive findings on diagnostic imaging of breast: Secondary | ICD-10-CM

## 2017-02-25 ENCOUNTER — Ambulatory Visit
Admission: RE | Admit: 2017-02-25 | Discharge: 2017-02-25 | Disposition: A | Discharge status: Home / Self Care | Payer: BLUE CROSS/BLUE SHIELD | Source: Ambulatory Visit | Attending: Nurse Practitioner | Admitting: Nurse Practitioner

## 2017-02-25 DIAGNOSIS — R928 Other abnormal and inconclusive findings on diagnostic imaging of breast: Secondary | ICD-10-CM | POA: Insufficient documentation

## 2017-04-01 ENCOUNTER — Ambulatory Visit
Admission: RE | Admit: 2017-04-01 | Discharge: 2017-04-01 | Disposition: A | Discharge status: Home / Self Care | Payer: BLUE CROSS/BLUE SHIELD | Source: Ambulatory Visit | Attending: Family Medicine | Admitting: Family Medicine

## 2017-04-01 ENCOUNTER — Other Ambulatory Visit: Payer: Self-pay | Admitting: Family Medicine

## 2017-04-01 DIAGNOSIS — J40 Bronchitis, not specified as acute or chronic: Secondary | ICD-10-CM

## 2017-04-01 DIAGNOSIS — J209 Acute bronchitis, unspecified: Secondary | ICD-10-CM | POA: Insufficient documentation

## 2017-08-25 ENCOUNTER — Other Ambulatory Visit: Payer: Self-pay | Admitting: Orthopedic Surgery

## 2017-08-25 DIAGNOSIS — Z96651 Presence of right artificial knee joint: Secondary | ICD-10-CM

## 2017-09-01 ENCOUNTER — Encounter
Admission: RE | Admit: 2017-09-01 | Discharge: 2017-09-01 | Disposition: A | Discharge status: Home / Self Care | Payer: Medicaid Other | Source: Ambulatory Visit | Attending: Orthopedic Surgery | Admitting: Orthopedic Surgery

## 2017-09-01 DIAGNOSIS — Z96651 Presence of right artificial knee joint: Secondary | ICD-10-CM | POA: Diagnosis present

## 2017-09-01 MED ORDER — TECHNETIUM TC 99M MEDRONATE IV KIT
25.0000 | PACK | Freq: Once | INTRAVENOUS | Status: AC | PRN
  Administered 2017-09-01: 23.35 via INTRAVENOUS

## 2018-04-19 IMAGING — MG MM DIGITAL DIAGNOSTIC UNILAT*L* W/ TOMO W/ CAD
4 series · 4 of 12 positions shown · non-contrast
Comparison: 02/12/2017

CLINICAL DATA: Patient returns after screening for evaluation of
possible left breast masses.

EXAM:
2D DIGITAL DIAGNOSTIC LEFT MAMMOGRAM WITH CAD AND ADJUNCT TOMO
ULTRASOUND LEFT BREAST

[L MLO synth-2D]
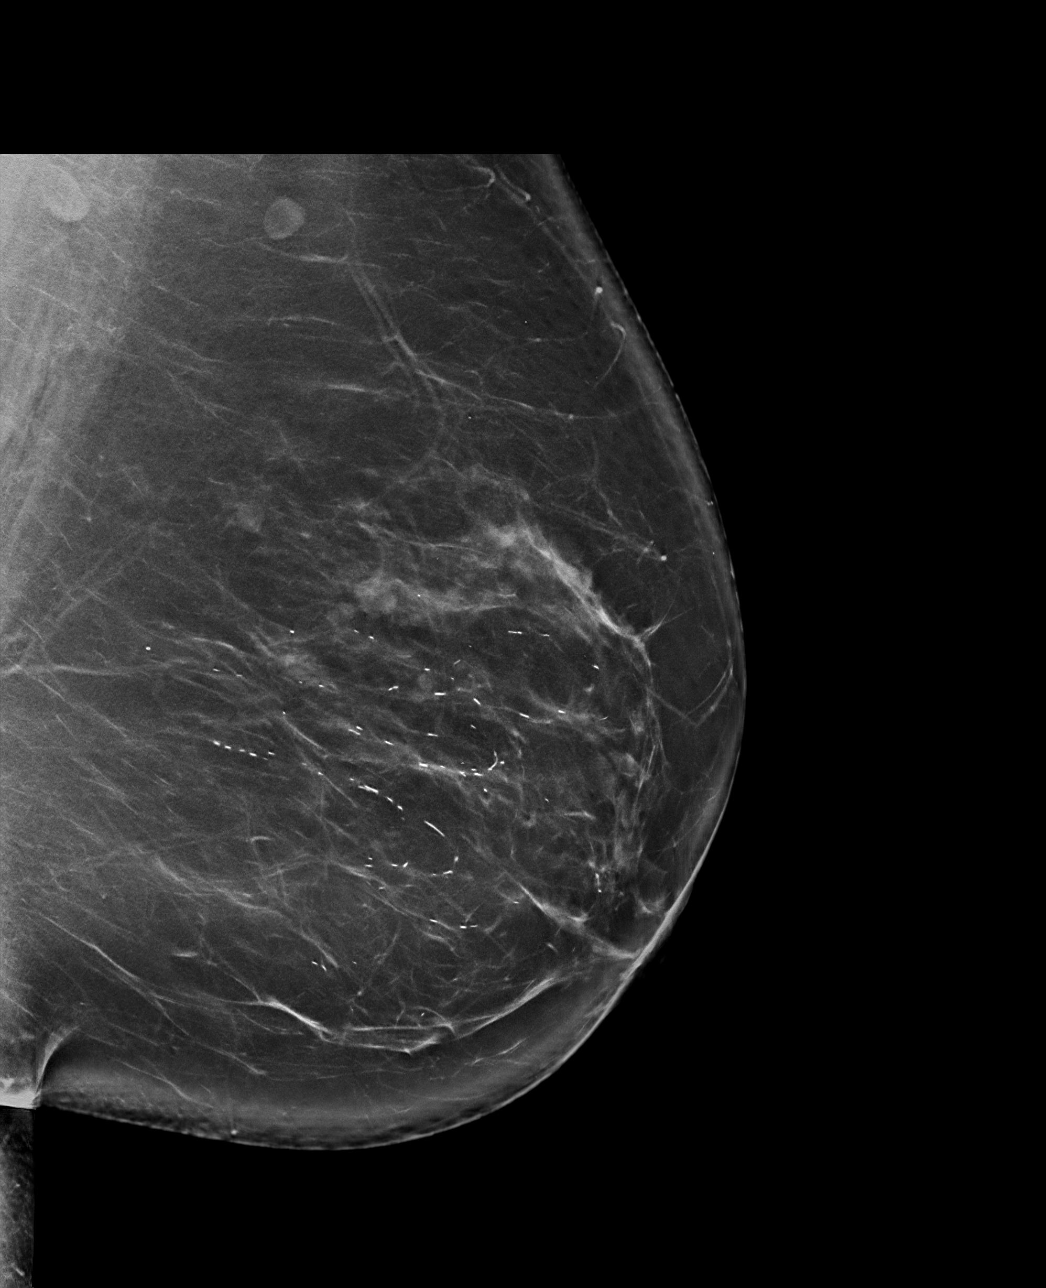

[L CC synth-2D]
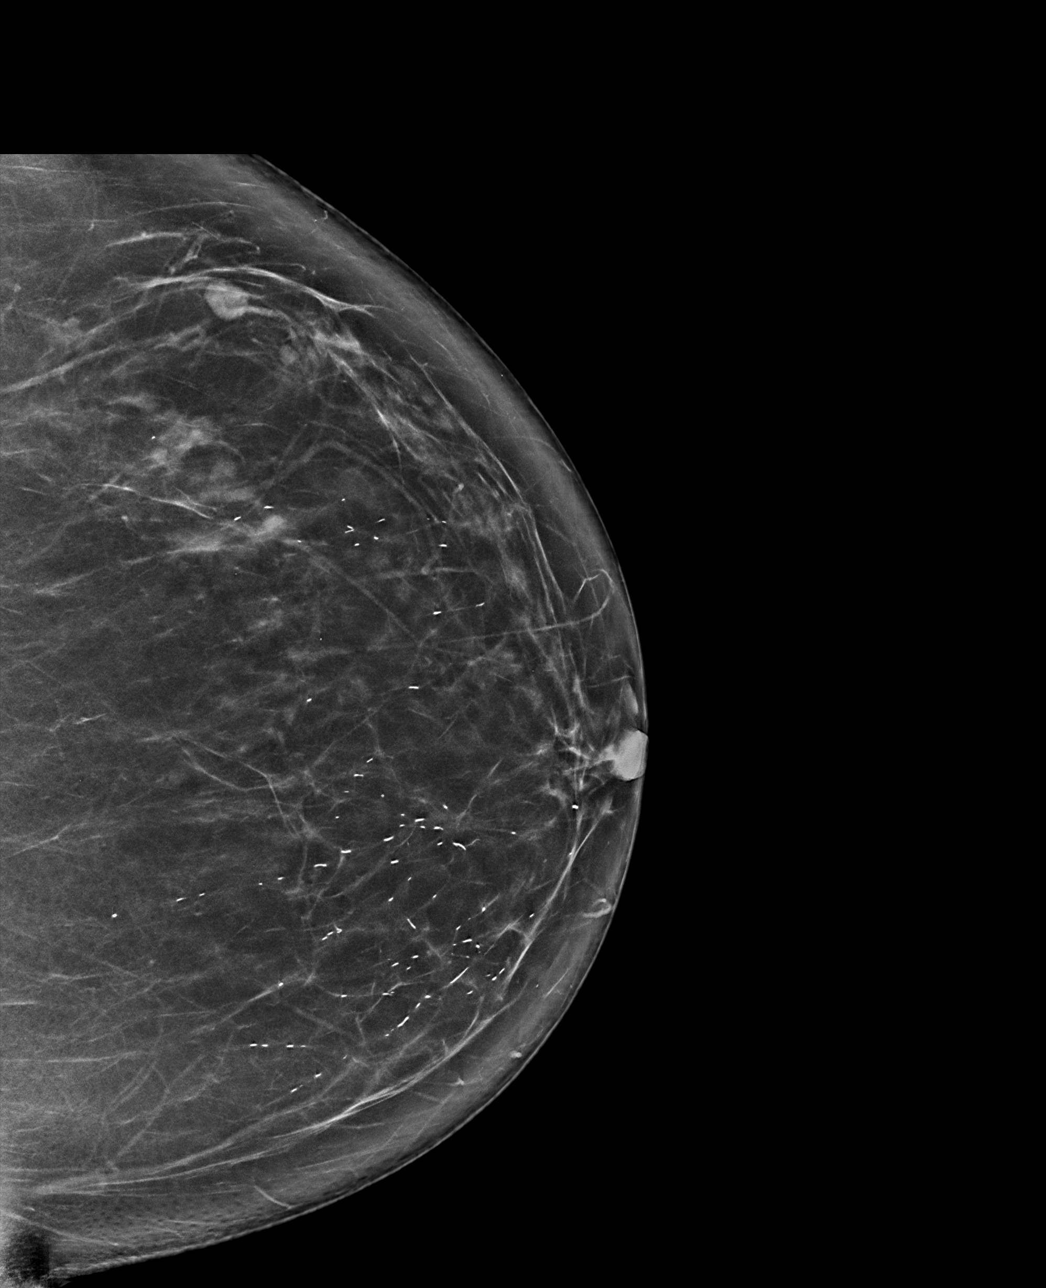

[L CC tomo · tomo slice 45/88.0]
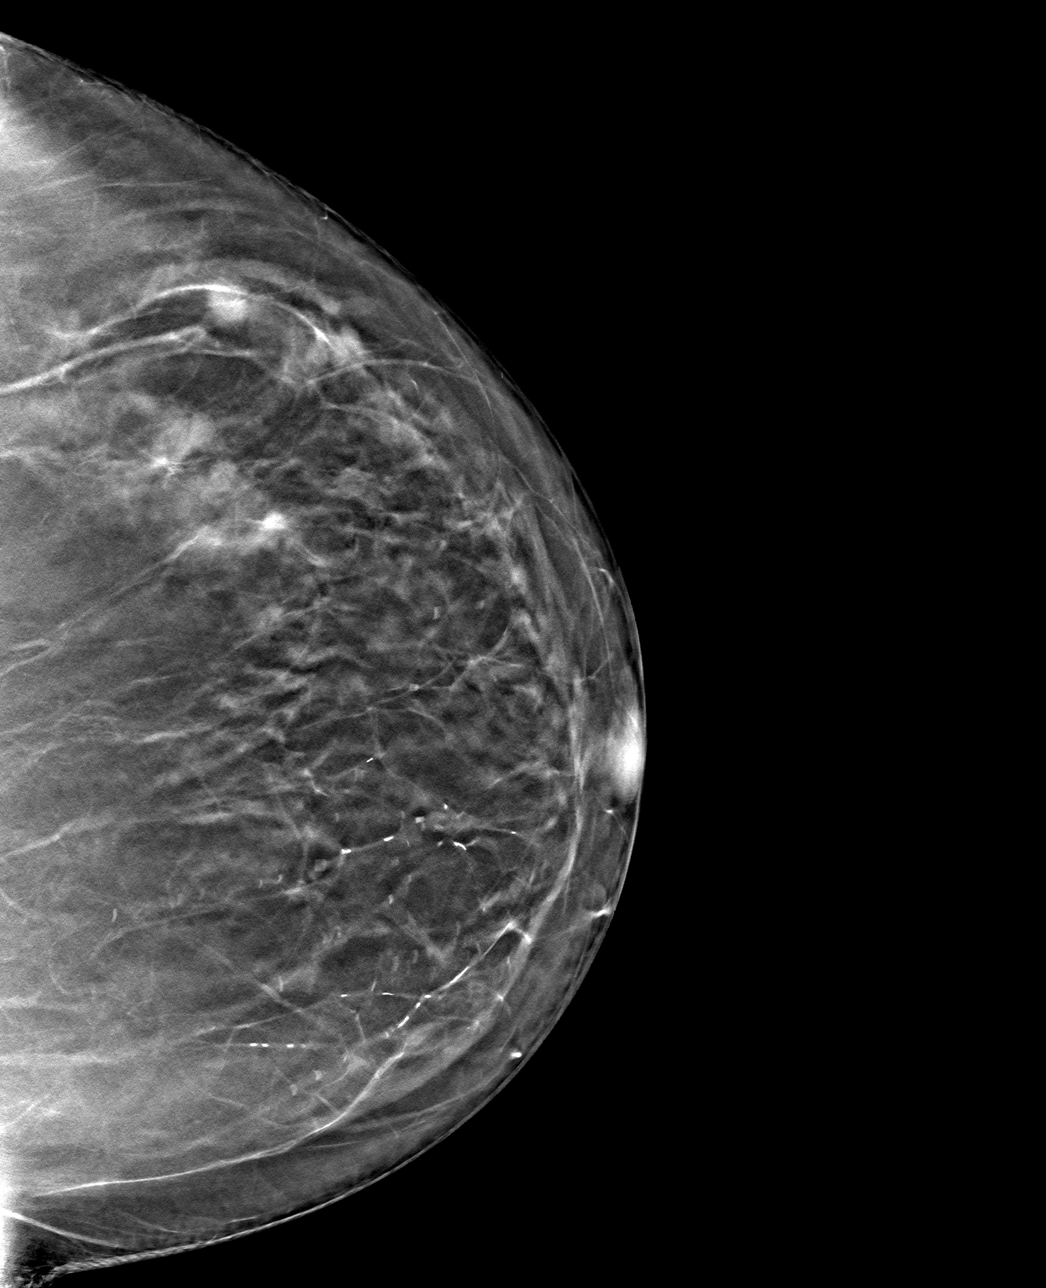

[L MLO tomo · tomo slice 53/105.0]
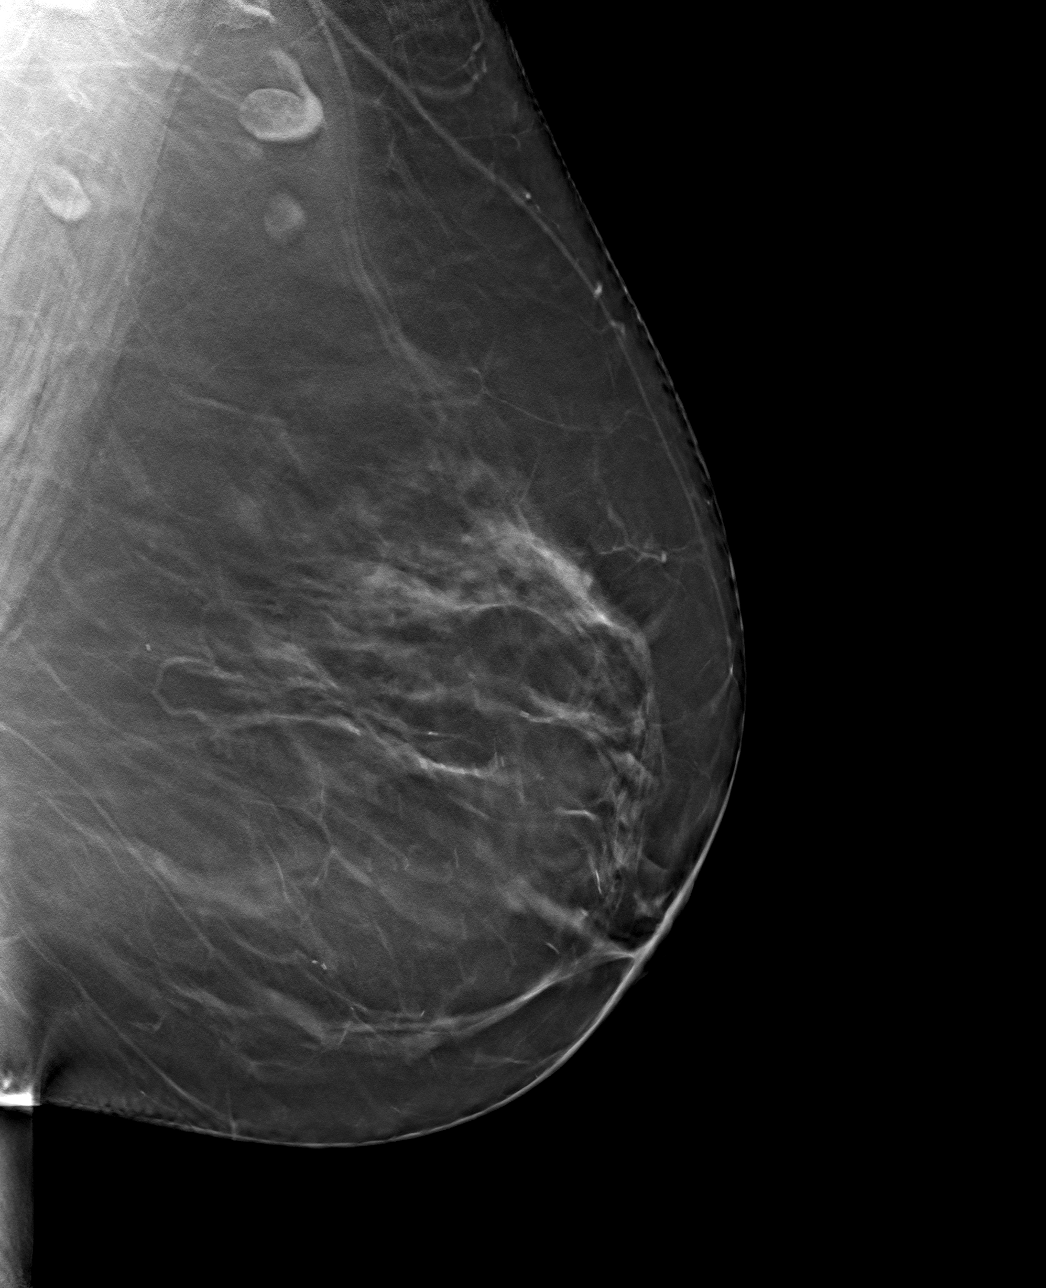

[4 of 12 positions shown; findings below may reference images not displayed]

ACR Breast Density Category b: There are scattered areas of
fibroglandular density.
FINDINGS: Additional 2-D and 3-D images are performed. These views confirm
presence of a circumscribed oval mass in the upper-outer quadrant of
the left breast. No suspicious mass or distortion identified.

Mammographic images were processed with CAD.

On physical exam, I palpate no abnormality in the upper-outer
quadrant of the left breast.

Targeted ultrasound is performed, showing a circumscribed hypoechoic
mass with hyperechoic center in the 230 o'clock location of the left
breast 7 cm from the nipple measuring 0.8 x 0.5 x 0.9 cm. Findings
are consistent with benign intramammary lymph node. An adjacent
lymph node measures 0.4 x 0.4 x 0.4 cm in the 3 o'clock location of
the left breast 5 cm from the nipple.
IMPRESSION: Benign intramammary lymph nodes in the upper-outer quadrant of the
left breast. No mammographic or ultrasound evidence for malignancy.

RECOMMENDATION:
Screening mammogram in one year.(Code:O9-A-1L9)

I have discussed the findings and recommendations with the patient.
Results were also provided in writing at the conclusion of the
visit. If applicable, a reminder letter will be sent to the patient
regarding the next appointment.

BI-RADS CATEGORY  2: Benign.

## 2018-04-29 ENCOUNTER — Ambulatory Visit
Admission: RE | Admit: 2018-04-29 | Discharge: 2018-04-29 | Disposition: A | Discharge status: Home / Self Care | Payer: Medicaid Other | Source: Ambulatory Visit | Attending: Oncology | Admitting: Oncology

## 2018-04-29 ENCOUNTER — Ambulatory Visit: Payer: Self-pay | Attending: Oncology

## 2018-04-29 VITALS — BP 118/81 | HR 65 | Temp 98.2°F | Ht 64.0 in | Wt 188.0 lb

## 2018-04-29 DIAGNOSIS — Z0000 Encounter for general adult medical examination without abnormal findings: Secondary | ICD-10-CM

## 2018-04-29 NOTE — Progress Notes (Signed)
  Subjective:     Patient ID: Vanessa Miller, female   DOB: Jun 14, 1961, 57 y.o.   MRN: [id number]  HPI   Review of Systems     Objective:   Physical Exam  Pulmonary/Chest: Right breast exhibits no inverted nipple, no mass, no nipple discharge, no skin change and no tenderness. Left breast exhibits no inverted nipple, no mass, no nipple discharge, no skin change and no tenderness. Breasts are symmetrical.  freckles covering upper body       Assessment:     57 year old patient presents for [facility] clinic visit.  Patient screened, and meets BCCCP eligibility.  Patient does not have insurance, Medicare or Medicaid.  Handout given on Affordable Care Act.  Instructed patient on breast self awareness using teach back method.  Clinical breast exam unremarkable.  No mass or lump palpated.  Patient had bariatric surgery earlier this year and states she has lost over 60 pounds.  Risk Assessment    Risk Scores      04/29/2018   Last edited by: Rico Junker, RN   5-year risk: 1.6 %   Lifetime risk: 11.2 %            Plan:     Sent for bilateral screening mammogram.

## 2018-08-20 IMAGING — US US BREAST*L* LIMITED INC AXILLA
1 series · 11 of 11 positions shown · non-contrast
Comparison: 02/12/2017

CLINICAL DATA: Patient returns after screening for evaluation of
possible left breast masses.

EXAM:
2D DIGITAL DIAGNOSTIC LEFT MAMMOGRAM WITH CAD AND ADJUNCT TOMO
ULTRASOUND LEFT BREAST

[Series 1: us breast*left* limited inc axilla · 0.09mm/px · 11 of 11 slices shown]
[im 1/11]
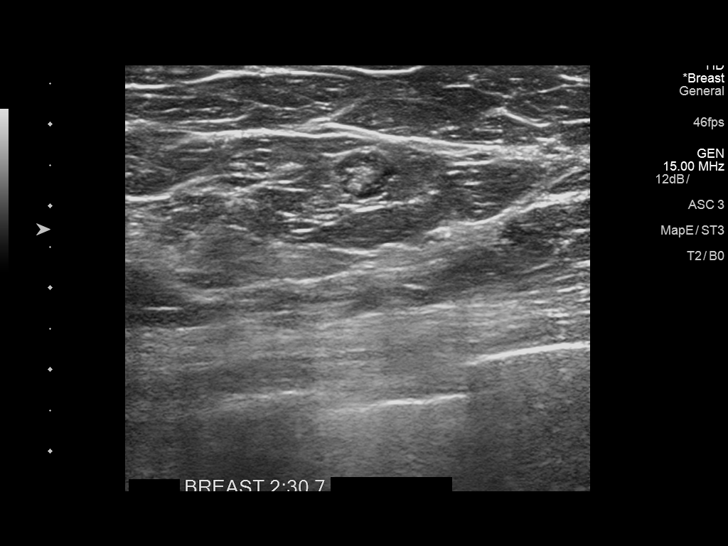
[im 2/11]
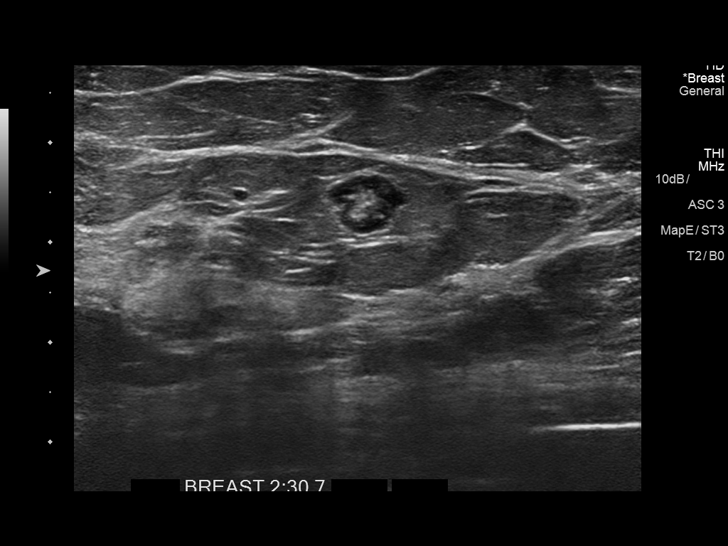
[im 3/11]
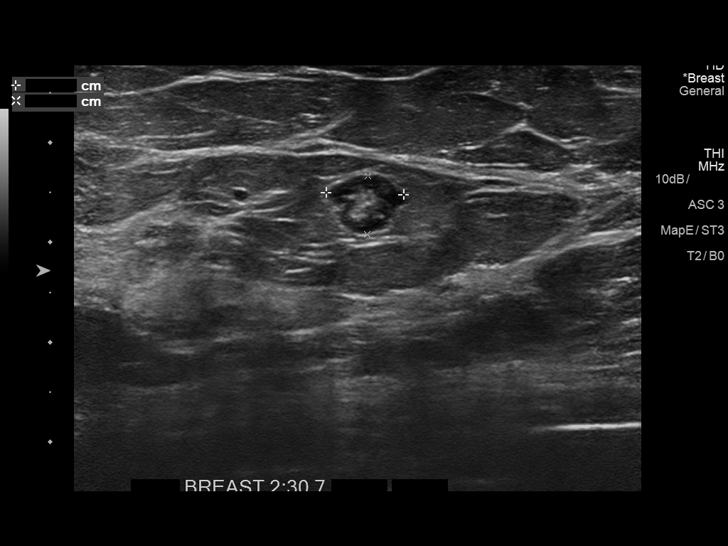
[im 4/11]
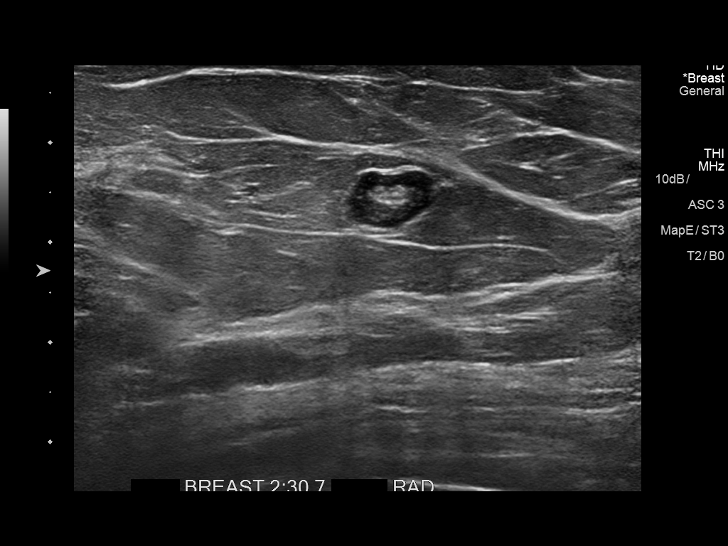
[im 5/11]
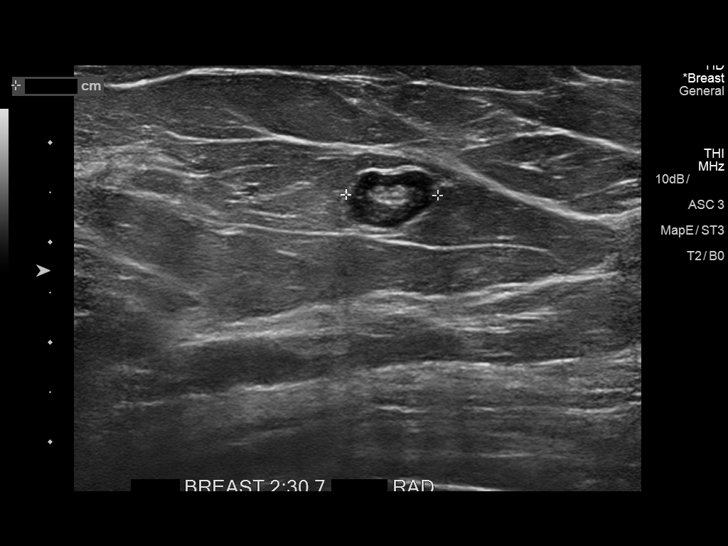
[im 6/11]
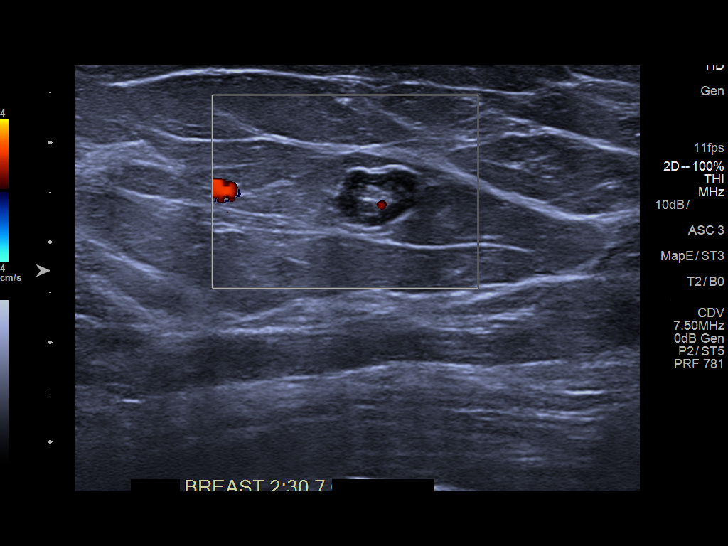
[im 7/11]
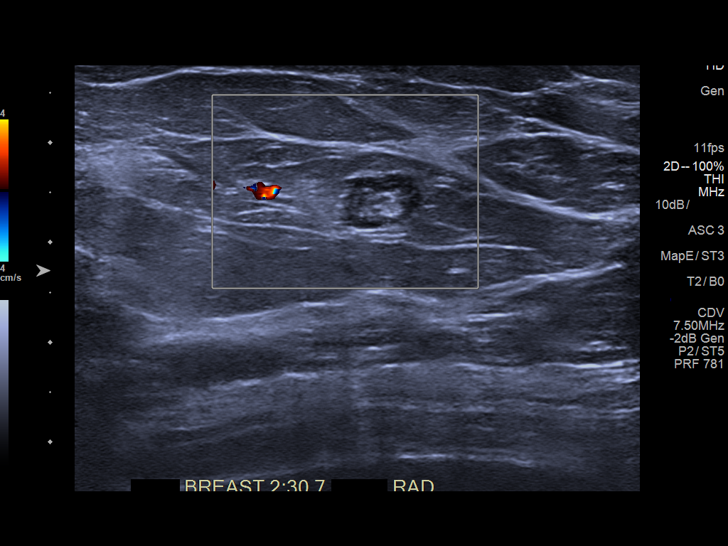
[im 8/11]
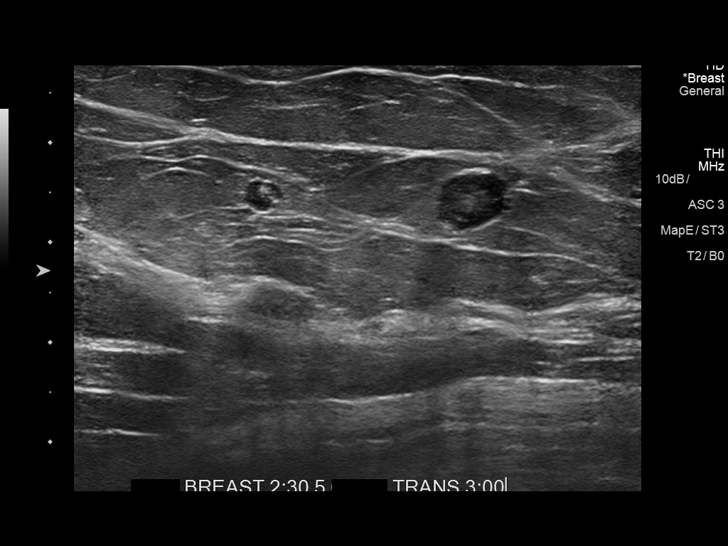
[im 9/11]
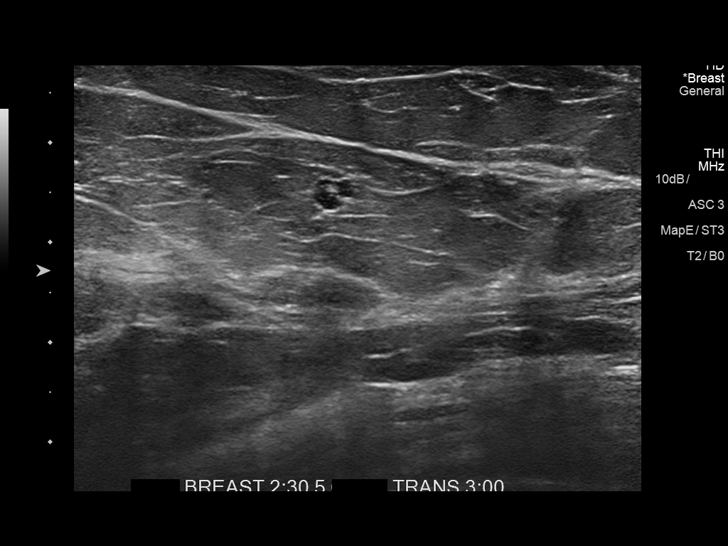
[im 10/11]
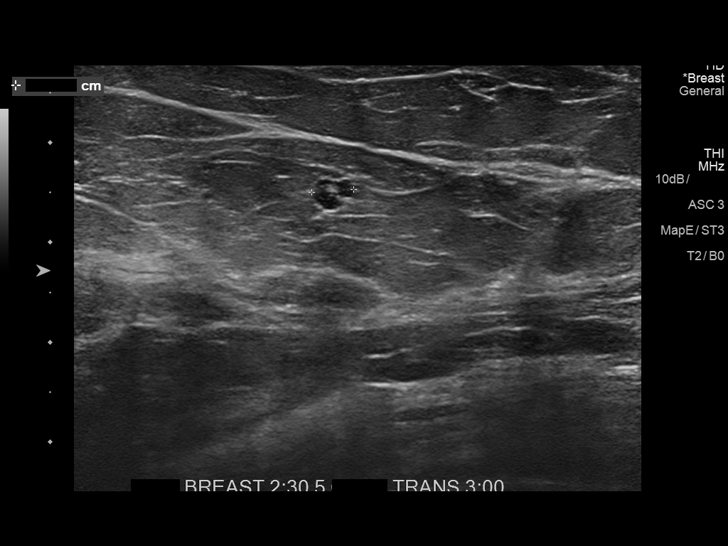
[im 11/11]
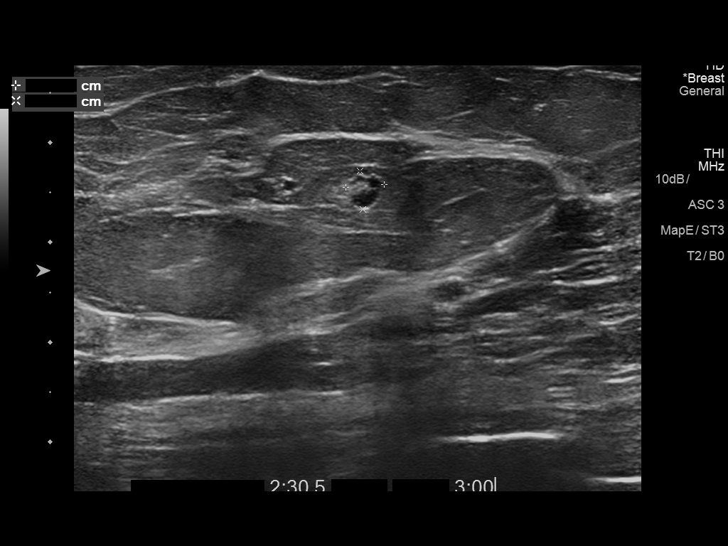

[11 of 11 positions shown; findings below may reference images not displayed]

ACR Breast Density Category b: There are scattered areas of
fibroglandular density.
FINDINGS: Additional 2-D and 3-D images are performed. These views confirm
presence of a circumscribed oval mass in the upper-outer quadrant of
the left breast. No suspicious mass or distortion identified.

Mammographic images were processed with CAD.

On physical exam, I palpate no abnormality in the upper-outer
quadrant of the left breast.

Targeted ultrasound is performed, showing a circumscribed hypoechoic
mass with hyperechoic center in the 230 o'clock location of the left
breast 7 cm from the nipple measuring 0.8 x 0.5 x 0.9 cm. Findings
are consistent with benign intramammary lymph node. An adjacent
lymph node measures 0.4 x 0.4 x 0.4 cm in the 3 o'clock location of
the left breast 5 cm from the nipple.
IMPRESSION: Benign intramammary lymph nodes in the upper-outer quadrant of the
left breast. No mammographic or ultrasound evidence for malignancy.

RECOMMENDATION:
Screening mammogram in one year.(Code:O9-A-1L9)

I have discussed the findings and recommendations with the patient.
Results were also provided in writing at the conclusion of the
visit. If applicable, a reminder letter will be sent to the patient
regarding the next appointment.

BI-RADS CATEGORY  2: Benign.

## 2018-09-06 NOTE — Progress Notes (Signed)
Letter mailed from [facility] to notify of normal mammogram results.  Patient to return in one year for annual screening.  Copy to [facility]. 

## 2022-04-05 ENCOUNTER — Other Ambulatory Visit: Payer: Self-pay | Admitting: Family Medicine

## 2022-04-05 DIAGNOSIS — Z1231 Encounter for screening mammogram for malignant neoplasm of breast: Secondary | ICD-10-CM

## 2022-04-29 ENCOUNTER — Other Ambulatory Visit: Payer: Self-pay | Admitting: Orthopedic Surgery

## 2022-04-29 DIAGNOSIS — M25562 Pain in left knee: Secondary | ICD-10-CM

## 2022-04-30 ENCOUNTER — Ambulatory Visit
Admission: RE | Admit: 2022-04-30 | Discharge: 2022-04-30 | Disposition: A | Discharge status: Home / Self Care | Payer: Medicaid Other | Source: Ambulatory Visit | Attending: Family Medicine | Admitting: Family Medicine

## 2022-04-30 DIAGNOSIS — Z1231 Encounter for screening mammogram for malignant neoplasm of breast: Secondary | ICD-10-CM | POA: Insufficient documentation

## 2022-05-02 ENCOUNTER — Ambulatory Visit
Admission: RE | Admit: 2022-05-02 | Discharge: 2022-05-02 | Disposition: A | Discharge status: Home / Self Care | Payer: Medicaid Other | Source: Ambulatory Visit | Attending: Orthopedic Surgery | Admitting: Orthopedic Surgery

## 2022-05-02 DIAGNOSIS — M25562 Pain in left knee: Secondary | ICD-10-CM | POA: Diagnosis present

## 2022-05-03 ENCOUNTER — Other Ambulatory Visit: Payer: Self-pay | Admitting: Family Medicine

## 2022-05-03 DIAGNOSIS — R928 Other abnormal and inconclusive findings on diagnostic imaging of breast: Secondary | ICD-10-CM

## 2022-05-03 DIAGNOSIS — R2231 Localized swelling, mass and lump, right upper limb: Secondary | ICD-10-CM

## 2022-05-16 ENCOUNTER — Other Ambulatory Visit: Payer: Medicaid Other

## 2022-05-31 ENCOUNTER — Ambulatory Visit
Admission: RE | Admit: 2022-05-31 | Discharge: 2022-05-31 | Disposition: A | Discharge status: Home / Self Care | Payer: Medicaid Other | Source: Ambulatory Visit | Attending: Family Medicine | Admitting: Family Medicine

## 2022-05-31 DIAGNOSIS — R928 Other abnormal and inconclusive findings on diagnostic imaging of breast: Secondary | ICD-10-CM | POA: Diagnosis present

## 2022-05-31 DIAGNOSIS — R2231 Localized swelling, mass and lump, right upper limb: Secondary | ICD-10-CM | POA: Insufficient documentation

## 2022-06-11 ENCOUNTER — Other Ambulatory Visit: Payer: Self-pay | Admitting: Family Medicine

## 2022-06-11 DIAGNOSIS — R599 Enlarged lymph nodes, unspecified: Secondary | ICD-10-CM

## 2022-06-11 DIAGNOSIS — R928 Other abnormal and inconclusive findings on diagnostic imaging of breast: Secondary | ICD-10-CM

## 2022-06-19 ENCOUNTER — Ambulatory Visit
Admission: RE | Admit: 2022-06-19 | Discharge: 2022-06-19 | Disposition: A | Discharge status: Home / Self Care | Payer: Medicaid Other | Source: Ambulatory Visit | Attending: Family Medicine | Admitting: Family Medicine

## 2022-06-19 DIAGNOSIS — C8584 Other specified types of non-Hodgkin lymphoma, lymph nodes of axilla and upper limb: Secondary | ICD-10-CM | POA: Insufficient documentation

## 2022-06-19 DIAGNOSIS — R599 Enlarged lymph nodes, unspecified: Secondary | ICD-10-CM

## 2022-06-19 DIAGNOSIS — R928 Other abnormal and inconclusive findings on diagnostic imaging of breast: Secondary | ICD-10-CM | POA: Diagnosis not present

## 2022-06-19 DIAGNOSIS — R590 Localized enlarged lymph nodes: Secondary | ICD-10-CM | POA: Insufficient documentation

## 2022-06-19 HISTORY — PX: BREAST BIOPSY: SHX20

## 2022-06-20 ENCOUNTER — Encounter: Payer: Self-pay | Admitting: Diagnostic Radiology

## 2022-06-25 LAB — SURGICAL PATHOLOGY

## 2022-07-04 ENCOUNTER — Other Ambulatory Visit: Payer: Self-pay | Admitting: *Deleted

## 2022-07-04 ENCOUNTER — Inpatient Hospital Stay: Payer: Medicaid Other

## 2022-07-04 ENCOUNTER — Encounter: Payer: Self-pay | Admitting: Internal Medicine

## 2022-07-04 ENCOUNTER — Other Ambulatory Visit: Payer: Self-pay

## 2022-07-04 ENCOUNTER — Inpatient Hospital Stay: Payer: Medicaid Other | Attending: Internal Medicine | Admitting: Internal Medicine

## 2022-07-04 VITALS — BP 120/77 | HR 71 | Temp 97.8°F | Resp 18

## 2022-07-04 DIAGNOSIS — C8208 Follicular lymphoma grade I, lymph nodes of multiple sites: Secondary | ICD-10-CM

## 2022-07-04 DIAGNOSIS — C8298 Follicular lymphoma, unspecified, lymph nodes of multiple sites: Secondary | ICD-10-CM | POA: Diagnosis not present

## 2022-07-04 DIAGNOSIS — C8200 Follicular lymphoma grade I, unspecified site: Secondary | ICD-10-CM | POA: Insufficient documentation

## 2022-07-04 DIAGNOSIS — C8290 Follicular lymphoma, unspecified, unspecified site: Secondary | ICD-10-CM | POA: Insufficient documentation

## 2022-07-04 LAB — COMPREHENSIVE METABOLIC PANEL
ALT: 14 U/L (ref 0–44)
AST: 19 U/L (ref 15–41)
Albumin: 4 g/dL (ref 3.5–5.0)
Alkaline Phosphatase: 69 U/L (ref 38–126)
Anion gap: 4 — ABNORMAL LOW (ref 5–15)
BUN: 14 mg/dL (ref 6–20)
CO2: 28 mmol/L (ref 22–32)
Calcium: 8.6 mg/dL — ABNORMAL LOW (ref 8.9–10.3)
Chloride: 106 mmol/L (ref 98–111)
Creatinine, Ser: 0.58 mg/dL (ref 0.44–1.00)
GFR, Estimated: >60 mL/min (ref >60)
Glucose, Bld: 83 mg/dL (ref 70–99)
Potassium: 4 mmol/L (ref 3.5–5.1)
Sodium: 138 mmol/L (ref 135–145)
Total Bilirubin: 0.4 mg/dL (ref 0.3–1.2)
Total Protein: 7.1 g/dL (ref 6.5–8.1)

## 2022-07-04 LAB — CBC WITH DIFFERENTIAL/PLATELET
Abs Immature Granulocytes: 0.02 10*3/uL (ref 0.00–0.07)
Basophils Absolute: 0.1 10*3/uL (ref 0.0–0.1)
Basophils Relative: 1 %
Eosinophils Absolute: 0.1 10*3/uL (ref 0.0–0.5)
Eosinophils Relative: 1 %
HCT: 40.3 % (ref 36.0–46.0)
Hemoglobin: 13.3 g/dL (ref 12.0–15.0)
Immature Granulocytes: 0 %
Lymphocytes Relative: 35 %
Lymphs Abs: 2.8 10*3/uL (ref 0.7–4.0)
MCH: 31.6 pg (ref 26.0–34.0)
MCHC: 33 g/dL (ref 30.0–36.0)
MCV: 95.7 fL (ref 80.0–100.0)
Monocytes Absolute: 0.6 10*3/uL (ref 0.1–1.0)
Monocytes Relative: 7 %
Neutro Abs: 4.4 10*3/uL (ref 1.7–7.7)
Neutrophils Relative %: 56 %
Platelets: 184 10*3/uL (ref 150–400)
RBC: 4.21 MIL/uL (ref 3.87–5.11)
RDW: 13.4 % (ref 11.5–15.5)
WBC: 7.9 10*3/uL (ref 4.0–10.5)
nRBC: 0 % (ref 0.0–0.2)

## 2022-07-04 LAB — LACTATE DEHYDROGENASE: LDH: 114 U/L (ref 98–192)
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
## 2022-07-04 NOTE — Progress Notes (Signed)
Patient here today for initial evaluation regarding lymphoma. Patient reports palpable lymph nodes to neck and night sweats.

## 2022-07-06 LAB — HIV ANTIBODY (ROUTINE TESTING W REFLEX): HIV Screen 4th Generation wRfx: NONREACTIVE

## 2022-07-09 LAB — HEPATITIS B CORE ANTIBODY, IGM

## 2022-07-09 LAB — HEPATITIS B SURFACE ANTIGEN

## 2022-07-23 ENCOUNTER — Ambulatory Visit
Admission: RE | Admit: 2022-07-23 | Discharge: 2022-07-23 | Disposition: A | Discharge status: Home / Self Care | Payer: Medicaid Other | Source: Ambulatory Visit | Attending: Internal Medicine | Admitting: Internal Medicine

## 2022-07-23 VITALS — Ht 64.0 in | Wt 138.0 lb

## 2022-07-23 DIAGNOSIS — C8208 Follicular lymphoma grade I, lymph nodes of multiple sites: Secondary | ICD-10-CM

## 2022-07-23 DIAGNOSIS — C8290 Follicular lymphoma, unspecified, unspecified site: Secondary | ICD-10-CM | POA: Diagnosis present

## 2022-07-23 DIAGNOSIS — I2510 Atherosclerotic heart disease of native coronary artery without angina pectoris: Secondary | ICD-10-CM | POA: Insufficient documentation

## 2022-07-23 DIAGNOSIS — I700 Atherosclerosis of aorta: Secondary | ICD-10-CM | POA: Insufficient documentation

## 2022-07-23 DIAGNOSIS — C8298 Follicular lymphoma, unspecified, lymph nodes of multiple sites: Secondary | ICD-10-CM

## 2022-07-23 DIAGNOSIS — N200 Calculus of kidney: Secondary | ICD-10-CM | POA: Insufficient documentation

## 2022-07-23 LAB — GLUCOSE, CAPILLARY: Glucose-Capillary: 88 mg/dL (ref 70–99)

## 2022-07-23 MED ORDER — FLUDEOXYGLUCOSE F - 18 (FDG) INJECTION
7.9300 | Freq: Once | INTRAVENOUS | Status: AC
  Administered 2022-07-23: 7.93 via INTRAVENOUS

## 2022-07-25 ENCOUNTER — Inpatient Hospital Stay: Payer: Medicaid Other | Attending: Internal Medicine | Admitting: Internal Medicine

## 2022-07-25 ENCOUNTER — Encounter: Payer: Self-pay | Admitting: Internal Medicine

## 2022-07-25 VITALS — BP 108/71 | HR 78 | Temp 96.4°F | Resp 16 | Ht 64.0 in | Wt 139.8 lb

## 2022-07-25 DIAGNOSIS — C8298 Follicular lymphoma, unspecified, lymph nodes of multiple sites: Secondary | ICD-10-CM

## 2022-07-25 DIAGNOSIS — C8200 Follicular lymphoma grade I, unspecified site: Secondary | ICD-10-CM | POA: Diagnosis present

## 2022-07-25 DIAGNOSIS — Z79899 Other long term (current) drug therapy: Secondary | ICD-10-CM | POA: Diagnosis not present
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
## 2022-07-25 NOTE — Progress Notes (Signed)
Pt in for follow up, son present as well. Pt returns for Pet scan results.

## 2022-10-25 ENCOUNTER — Inpatient Hospital Stay: Payer: Medicaid Other | Admitting: Internal Medicine

## 2022-10-29 ENCOUNTER — Encounter: Payer: Self-pay | Admitting: Internal Medicine

## 2022-10-29 ENCOUNTER — Inpatient Hospital Stay: Payer: Medicaid Other

## 2022-10-29 ENCOUNTER — Inpatient Hospital Stay: Payer: Medicaid Other | Attending: Internal Medicine | Admitting: Internal Medicine

## 2022-10-29 VITALS — BP 95/66 | HR 78 | Temp 97.8°F | Resp 20 | Wt 139.4 lb

## 2022-10-29 DIAGNOSIS — C8200 Follicular lymphoma grade I, unspecified site: Secondary | ICD-10-CM | POA: Diagnosis present

## 2022-10-29 DIAGNOSIS — C8298 Follicular lymphoma, unspecified, lymph nodes of multiple sites: Secondary | ICD-10-CM | POA: Diagnosis not present

## 2022-10-29 LAB — COMPREHENSIVE METABOLIC PANEL
ALT: 15 U/L (ref 0–44)
AST: 23 U/L (ref 15–41)
Albumin: 4 g/dL (ref 3.5–5.0)
Alkaline Phosphatase: 61 U/L (ref 38–126)
Anion gap: 8 (ref 5–15)
BUN: 15 mg/dL (ref 6–20)
CO2: 27 mmol/L (ref 22–32)
Calcium: 8.7 mg/dL — ABNORMAL LOW (ref 8.9–10.3)
Chloride: 99 mmol/L (ref 98–111)
Creatinine, Ser: 0.67 mg/dL (ref 0.44–1.00)
GFR, Estimated: >60 mL/min (ref >60)
Glucose, Bld: 91 mg/dL (ref 70–99)
Potassium: 4.1 mmol/L (ref 3.5–5.1)
Sodium: 134 mmol/L — ABNORMAL LOW (ref 135–145)
Total Bilirubin: 0.2 mg/dL — ABNORMAL LOW (ref 0.3–1.2)
Total Protein: 6.8 g/dL (ref 6.5–8.1)

## 2022-10-29 LAB — CBC WITH DIFFERENTIAL/PLATELET
Abs Immature Granulocytes: 0.02 10*3/uL (ref 0.00–0.07)
Basophils Absolute: 0.1 10*3/uL (ref 0.0–0.1)
Basophils Relative: 1 %
Eosinophils Absolute: 0.1 10*3/uL (ref 0.0–0.5)
Eosinophils Relative: 1 %
HCT: 40.5 % (ref 36.0–46.0)
Hemoglobin: 14.1 g/dL (ref 12.0–15.0)
Immature Granulocytes: 0 %
Lymphocytes Relative: 36 %
Lymphs Abs: 3 10*3/uL (ref 0.7–4.0)
MCH: 32.5 pg (ref 26.0–34.0)
MCHC: 34.8 g/dL (ref 30.0–36.0)
MCV: 93.3 fL (ref 80.0–100.0)
Monocytes Absolute: 0.7 10*3/uL (ref 0.1–1.0)
Monocytes Relative: 8 %
Neutro Abs: 4.4 10*3/uL (ref 1.7–7.7)
Neutrophils Relative %: 54 %
Platelets: 210 10*3/uL (ref 150–400)
RBC: 4.34 MIL/uL (ref 3.87–5.11)
RDW: 13.2 % (ref 11.5–15.5)
WBC: 8.2 10*3/uL (ref 4.0–10.5)
nRBC: 0 % (ref 0.0–0.2)

## 2022-10-29 LAB — LACTATE DEHYDROGENASE: LDH: 109 U/L (ref 98–192)
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
## 2023-01-28 ENCOUNTER — Inpatient Hospital Stay: Payer: Medicaid Other | Admitting: Internal Medicine

## 2023-01-28 ENCOUNTER — Encounter: Payer: Self-pay | Admitting: Internal Medicine

## 2023-01-28 ENCOUNTER — Inpatient Hospital Stay: Payer: Medicaid Other | Attending: Internal Medicine

## 2023-01-28 VITALS — BP 111/71 | HR 67 | Temp 97.9°F | Resp 20 | Wt 143.8 lb

## 2023-01-28 DIAGNOSIS — R61 Generalized hyperhidrosis: Secondary | ICD-10-CM | POA: Insufficient documentation

## 2023-01-28 DIAGNOSIS — C8298 Follicular lymphoma, unspecified, lymph nodes of multiple sites: Secondary | ICD-10-CM

## 2023-01-28 DIAGNOSIS — Z79899 Other long term (current) drug therapy: Secondary | ICD-10-CM | POA: Diagnosis not present

## 2023-01-28 DIAGNOSIS — C8200 Follicular lymphoma grade I, unspecified site: Secondary | ICD-10-CM | POA: Diagnosis present

## 2023-01-28 LAB — CBC WITH DIFFERENTIAL/PLATELET
Abs Immature Granulocytes: 0.02 10*3/uL (ref 0.00–0.07)
Basophils Absolute: 0.1 10*3/uL (ref 0.0–0.1)
Basophils Relative: 1 %
Eosinophils Absolute: 0.1 10*3/uL (ref 0.0–0.5)
Eosinophils Relative: 1 %
HCT: 38.7 % (ref 36.0–46.0)
Hemoglobin: 12.9 g/dL (ref 12.0–15.0)
Immature Granulocytes: 0 %
Lymphocytes Relative: 29 %
Lymphs Abs: 2.3 10*3/uL (ref 0.7–4.0)
MCH: 32.7 pg (ref 26.0–34.0)
MCHC: 33.3 g/dL (ref 30.0–36.0)
MCV: 98.2 fL (ref 80.0–100.0)
Monocytes Absolute: 0.7 10*3/uL (ref 0.1–1.0)
Monocytes Relative: 9 %
Neutro Abs: 4.9 10*3/uL (ref 1.7–7.7)
Neutrophils Relative %: 60 %
Platelets: 177 10*3/uL (ref 150–400)
RBC: 3.94 MIL/uL (ref 3.87–5.11)
RDW: 13.3 % (ref 11.5–15.5)
WBC: 8 10*3/uL (ref 4.0–10.5)
nRBC: 0 % (ref 0.0–0.2)

## 2023-01-28 LAB — COMPREHENSIVE METABOLIC PANEL
ALT: 17 U/L (ref 0–44)
AST: 22 U/L (ref 15–41)
Albumin: 3.8 g/dL (ref 3.5–5.0)
Alkaline Phosphatase: 78 U/L (ref 38–126)
Anion gap: 6 (ref 5–15)
BUN: 16 mg/dL (ref 6–20)
CO2: 27 mmol/L (ref 22–32)
Calcium: 8.5 mg/dL — ABNORMAL LOW (ref 8.9–10.3)
Chloride: 105 mmol/L (ref 98–111)
Creatinine, Ser: 0.58 mg/dL (ref 0.44–1.00)
GFR, Estimated: >60 mL/min (ref >60)
Glucose, Bld: 78 mg/dL (ref 70–99)
Potassium: 4 mmol/L (ref 3.5–5.1)
Sodium: 138 mmol/L (ref 135–145)
Total Bilirubin: 0.3 mg/dL (ref 0.3–1.2)
Total Protein: 6.4 g/dL — ABNORMAL LOW (ref 6.5–8.1)

## 2023-01-28 LAB — LACTATE DEHYDROGENASE: LDH: 124 U/L (ref 98–192)

## 2023-01-28 NOTE — Progress Notes (Signed)
Patient states she has been having more night sweats.
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
## 2023-02-18 ENCOUNTER — Ambulatory Visit
Admission: RE | Admit: 2023-02-18 | Discharge: 2023-02-18 | Disposition: A | Discharge status: Home / Self Care | Payer: Medicaid Other | Source: Ambulatory Visit | Attending: Internal Medicine | Admitting: Internal Medicine

## 2023-02-18 DIAGNOSIS — C8298 Follicular lymphoma, unspecified, lymph nodes of multiple sites: Secondary | ICD-10-CM | POA: Insufficient documentation

## 2023-02-18 DIAGNOSIS — I2510 Atherosclerotic heart disease of native coronary artery without angina pectoris: Secondary | ICD-10-CM | POA: Diagnosis not present

## 2023-02-18 DIAGNOSIS — I700 Atherosclerosis of aorta: Secondary | ICD-10-CM | POA: Diagnosis not present

## 2023-02-18 DIAGNOSIS — N200 Calculus of kidney: Secondary | ICD-10-CM | POA: Diagnosis not present

## 2023-02-18 DIAGNOSIS — C8290 Follicular lymphoma, unspecified, unspecified site: Secondary | ICD-10-CM | POA: Diagnosis present

## 2023-02-18 LAB — GLUCOSE, CAPILLARY: Glucose-Capillary: 80 mg/dL (ref 70–99)

## 2023-02-18 MED ORDER — FLUDEOXYGLUCOSE F - 18 (FDG) INJECTION
8.0600 | Freq: Once | INTRAVENOUS | Status: AC | PRN
  Administered 2023-02-18: 8.06 via INTRAVENOUS

## 2023-02-22 ENCOUNTER — Encounter: Payer: Self-pay | Admitting: Internal Medicine

## 2023-02-24 ENCOUNTER — Telehealth: Payer: Self-pay | Admitting: *Deleted

## 2023-02-24 NOTE — Telephone Encounter (Signed)
I called and discussed with the patient. Progression is slow. Continue with surveillance. Will keep the appointment as scheduled.

## 2023-02-24 NOTE — Telephone Encounter (Signed)
Call from patient asking for someone to call her re her PET results. Her next appointment is not until August.  IMPRESSION: 1. Disease progression, including new cervical, mediastinal, and right hilar nodal stations. Progression hypermetabolism within axillary, upper abdominal, and left inguinal nodes. 2. Mildly heterogeneous activity about the bony pelvis. Cannot exclude early marrow involvement. 3. Incidental findings, including: Coronary artery atherosclerosis. Aortic Atherosclerosis (ICD10-I70.0). Left nephrolithiasis     Electronically Signed   By: Jeronimo Greaves M.D.   On: 02/21/2023 13:37

## 2023-03-17 ENCOUNTER — Telehealth: Payer: Self-pay | Admitting: *Deleted

## 2023-03-17 NOTE — Telephone Encounter (Signed)
Patient called reporting that she is having night sweats more frequently (2-3 times per week) and is asking for medicine for it as discussed at last appointment . Please advise

## 2023-04-08 ENCOUNTER — Encounter: Payer: Self-pay | Admitting: Internal Medicine

## 2023-04-08 ENCOUNTER — Inpatient Hospital Stay: Payer: Medicaid Other | Attending: Internal Medicine | Admitting: Internal Medicine

## 2023-04-08 DIAGNOSIS — C8200 Follicular lymphoma grade I, unspecified site: Secondary | ICD-10-CM | POA: Insufficient documentation

## 2023-04-08 DIAGNOSIS — C8298 Follicular lymphoma, unspecified, lymph nodes of multiple sites: Secondary | ICD-10-CM

## 2023-04-08 DIAGNOSIS — Z79899 Other long term (current) drug therapy: Secondary | ICD-10-CM | POA: Insufficient documentation

## 2023-04-08 DIAGNOSIS — R590 Localized enlarged lymph nodes: Secondary | ICD-10-CM | POA: Insufficient documentation

## 2023-04-08 DIAGNOSIS — Z5111 Encounter for antineoplastic chemotherapy: Secondary | ICD-10-CM | POA: Insufficient documentation

## 2023-04-08 DIAGNOSIS — R61 Generalized hyperhidrosis: Secondary | ICD-10-CM | POA: Diagnosis not present

## 2023-04-08 DIAGNOSIS — R0602 Shortness of breath: Secondary | ICD-10-CM | POA: Insufficient documentation

## 2023-04-08 NOTE — Progress Notes (Signed)
START ON PATHWAY REGIMEN - Lymphoma and CLL     A cycle is every 7 days:     Rituximab-xxxx   **Always confirm dose/schedule in your pharmacy ordering system**  Patient Characteristics: Follicular Lymphoma, Grades 1, 2, and 3A, First Line, Stage III / IV, Asymptomatic or Low Bulk Disease Disease Type: Follicular Lymphoma, Grade 1, 2, or 3A Disease Type: Not Applicable Disease Type: Not Applicable Line of Therapy: First Line Disease Characteristics: Asymptomatic or Low Bulk Disease Intent of Therapy: Non-Curative / Palliative Intent, Discussed with Patient 
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
## 2023-04-08 NOTE — Progress Notes (Signed)
Patient is having the urgency to go to the bathroom but it takes her to really concentrate to be able to urinate. Fatigue is more of an issue for her this year than last year.

## 2023-04-09 ENCOUNTER — Inpatient Hospital Stay: Payer: Medicaid Other

## 2023-04-09 ENCOUNTER — Other Ambulatory Visit: Payer: Self-pay

## 2023-04-09 DIAGNOSIS — C8200 Follicular lymphoma grade I, unspecified site: Secondary | ICD-10-CM | POA: Diagnosis present

## 2023-04-09 DIAGNOSIS — R0602 Shortness of breath: Secondary | ICD-10-CM | POA: Diagnosis not present

## 2023-04-09 DIAGNOSIS — R590 Localized enlarged lymph nodes: Secondary | ICD-10-CM | POA: Diagnosis not present

## 2023-04-09 DIAGNOSIS — Z5111 Encounter for antineoplastic chemotherapy: Secondary | ICD-10-CM | POA: Diagnosis present

## 2023-04-09 DIAGNOSIS — C8298 Follicular lymphoma, unspecified, lymph nodes of multiple sites: Secondary | ICD-10-CM

## 2023-04-09 DIAGNOSIS — Z79899 Other long term (current) drug therapy: Secondary | ICD-10-CM | POA: Diagnosis not present

## 2023-04-09 LAB — URINALYSIS, COMPLETE (UACMP) WITH MICROSCOPIC
Bacteria, UA: NONE SEEN
Bilirubin Urine: NEGATIVE
Glucose, UA: NEGATIVE mg/dL
Ketones, ur: NEGATIVE mg/dL
Leukocytes,Ua: NEGATIVE
Nitrite: NEGATIVE
Protein, ur: NEGATIVE mg/dL
Specific Gravity, Urine: 1.027 (ref 1.005–1.030)
pH: 5 (ref 5.0–8.0)

## 2023-04-09 LAB — COMPREHENSIVE METABOLIC PANEL
ALT: 15 U/L (ref 0–44)
AST: 22 U/L (ref 15–41)
Albumin: 4 g/dL (ref 3.5–5.0)
Alkaline Phosphatase: 76 U/L (ref 38–126)
Anion gap: 8 (ref 5–15)
BUN: 13 mg/dL (ref 6–20)
CO2: 25 mmol/L (ref 22–32)
Calcium: 8.8 mg/dL — ABNORMAL LOW (ref 8.9–10.3)
Chloride: 105 mmol/L (ref 98–111)
Creatinine, Ser: 0.72 mg/dL (ref 0.44–1.00)
GFR, Estimated: >60 mL/min (ref >60)
Glucose, Bld: 151 mg/dL — ABNORMAL HIGH (ref 70–99)
Potassium: 4.2 mmol/L (ref 3.5–5.1)
Sodium: 138 mmol/L (ref 135–145)
Total Bilirubin: 0.3 mg/dL (ref 0.3–1.2)
Total Protein: 7 g/dL (ref 6.5–8.1)

## 2023-04-09 LAB — CBC WITH DIFFERENTIAL/PLATELET
Abs Immature Granulocytes: 0.01 10*3/uL (ref 0.00–0.07)
Basophils Absolute: 0.1 10*3/uL (ref 0.0–0.1)
Basophils Relative: 1 %
Eosinophils Absolute: 0.1 10*3/uL (ref 0.0–0.5)
Eosinophils Relative: 1 %
HCT: 41.7 % (ref 36.0–46.0)
Hemoglobin: 13.6 g/dL (ref 12.0–15.0)
Immature Granulocytes: 0 %
Lymphocytes Relative: 31 %
Lymphs Abs: 2.6 10*3/uL (ref 0.7–4.0)
MCH: 32 pg (ref 26.0–34.0)
MCHC: 32.6 g/dL (ref 30.0–36.0)
MCV: 98.1 fL (ref 80.0–100.0)
Monocytes Absolute: 0.5 10*3/uL (ref 0.1–1.0)
Monocytes Relative: 6 %
Neutro Abs: 5.1 10*3/uL (ref 1.7–7.7)
Neutrophils Relative %: 61 %
Platelets: 195 10*3/uL (ref 150–400)
RBC: 4.25 MIL/uL (ref 3.87–5.11)
RDW: 13.2 % (ref 11.5–15.5)
WBC: 8.3 10*3/uL (ref 4.0–10.5)
nRBC: 0 % (ref 0.0–0.2)

## 2023-04-09 LAB — HEPATITIS B SURFACE ANTIGEN: Hepatitis B Surface Ag: NONREACTIVE

## 2023-04-09 LAB — HEPATITIS B CORE ANTIBODY, IGM: Hep B C IgM: NONREACTIVE

## 2023-04-09 LAB — HEPATITIS C ANTIBODY: HCV Ab: NONREACTIVE

## 2023-04-09 LAB — LACTATE DEHYDROGENASE: LDH: 110 U/L (ref 98–192)

## 2023-04-10 LAB — URINE CULTURE: Culture: 10000 — AB

## 2023-04-11 LAB — URINE CULTURE

## 2023-04-14 ENCOUNTER — Telehealth: Payer: Self-pay

## 2023-04-14 ENCOUNTER — Inpatient Hospital Stay: Payer: Medicaid Other

## 2023-04-14 ENCOUNTER — Other Ambulatory Visit: Payer: Self-pay | Admitting: Internal Medicine

## 2023-04-14 MED ORDER — CIPROFLOXACIN HCL 250 MG PO TABS: 250.0000 mg | ORAL_TABLET | Freq: Two times a day (BID) | ORAL | 0 refills | Status: AC

## 2023-04-14 NOTE — Progress Notes (Signed)
Patient has urinary symptoms. We usually do not treat E.coli 10,000 colonies but becaues she is symptomatic I will consider 3 days of cipro and assess her symptoms.

## 2023-04-14 NOTE — Telephone Encounter (Signed)
Spoke with patient on the phone to let her know about the antibiotic that was sent to her pharmacy and the reason why. Patient understood.

## 2023-04-15 ENCOUNTER — Inpatient Hospital Stay: Payer: Medicaid Other

## 2023-04-15 ENCOUNTER — Other Ambulatory Visit: Payer: Self-pay

## 2023-04-17 ENCOUNTER — Other Ambulatory Visit: Payer: Self-pay

## 2023-04-17 ENCOUNTER — Inpatient Hospital Stay: Payer: Medicaid Other

## 2023-04-17 ENCOUNTER — Encounter: Payer: Self-pay | Admitting: Internal Medicine

## 2023-04-18 ENCOUNTER — Inpatient Hospital Stay: Payer: Medicaid Other | Admitting: Internal Medicine

## 2023-04-18 ENCOUNTER — Inpatient Hospital Stay: Payer: Medicaid Other

## 2023-04-18 VITALS — BP 104/60 | HR 80 | Temp 97.5°F | Resp 16

## 2023-04-18 VITALS — BP 95/71 | HR 76 | Temp 96.6°F | Wt 134.8 lb

## 2023-04-18 DIAGNOSIS — R0600 Dyspnea, unspecified: Secondary | ICD-10-CM | POA: Diagnosis not present

## 2023-04-18 DIAGNOSIS — C8298 Follicular lymphoma, unspecified, lymph nodes of multiple sites: Secondary | ICD-10-CM

## 2023-04-18 DIAGNOSIS — Z5111 Encounter for antineoplastic chemotherapy: Secondary | ICD-10-CM | POA: Diagnosis not present

## 2023-04-18 MED ORDER — ACETAMINOPHEN 325 MG PO TABS
650.0000 mg | ORAL_TABLET | Freq: Once | ORAL | Status: AC
  Administered 2023-04-18: 650 mg via ORAL
  Filled 2023-04-18: qty 2

## 2023-04-18 MED ORDER — SODIUM CHLORIDE 0.9 % IV SOLN
Freq: Once | INTRAVENOUS | Status: AC
  Filled 2023-04-18: qty 250

## 2023-04-18 MED ORDER — SODIUM CHLORIDE 0.9 % IV SOLN
375.0000 mg/m2 | Freq: Once | INTRAVENOUS | Status: DC
  Filled 2023-04-18: qty 60

## 2023-04-18 MED ORDER — SODIUM CHLORIDE 0.9 % IV SOLN
375.0000 mg/m2 | Freq: Once | INTRAVENOUS | Status: AC
  Administered 2023-04-18: 600 mg via INTRAVENOUS
  Filled 2023-04-18: qty 50

## 2023-04-18 MED ORDER — DIPHENHYDRAMINE HCL 25 MG PO CAPS
50.0000 mg | ORAL_CAPSULE | Freq: Once | ORAL | Status: AC
  Administered 2023-04-18: 50 mg via ORAL
  Filled 2023-04-18: qty 2
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
## 2023-04-18 NOTE — Patient Instructions (Signed)
[place] [facility] REG[facility]  Discharge Instructions: Thank you for choosing [facility] to provide your oncology and hematology care.  If you have a lab appointment with the Cancer Center, please go directly to the Cancer Center and check in at the registration area.  Wear comfortable clothing and clothing appropriate for easy access to any Portacath or PICC line.   We strive to give you quality time with your provider. You may need to reschedule your appointment if you arrive late (15 or more minutes).  Arriving late affects you and other patients whose appointments are after yours.  Also, if you miss three or more appointments without notifying the office, you may be dismissed from the clinic at the provider's discretion.      For prescription refill requests, have your pharmacy contact our office and allow 72 hours for refills to be completed.    Today you received the following chemotherapy and/or immunotherapy agents rituximab    To help prevent nausea and vomiting after your treatment, we encourage you to take your nausea medication as directed.  BELOW ARE SYMPTOMS THAT SHOULD BE REPORTED IMMEDIATELY: *FEVER GREATER THAN 100.4 F (38 C) OR HIGHER *CHILLS OR SWEATING *NAUSEA AND VOMITING THAT IS NOT CONTROLLED WITH YOUR NAUSEA MEDICATION *UNUSUAL SHORTNESS OF BREATH *UNUSUAL BRUISING OR BLEEDING *URINARY PROBLEMS (pain or burning when urinating, or frequent urination) *BOWEL PROBLEMS (unusual diarrhea, constipation, pain near the anus) TENDERNESS IN MOUTH AND THROAT WITH OR WITHOUT PRESENCE OF ULCERS (sore throat, sores in mouth, or a toothache) UNUSUAL RASH, SWELLING OR PAIN  UNUSUAL VAGINAL DISCHARGE OR ITCHING   Items with * indicate a potential emergency and should be followed up as soon as possible or go to the Emergency Department if any problems should occur.  Please show the CHEMOTHERAPY ALERT CARD or IMMUNOTHERAPY ALERT CARD at check-in to  the Emergency Department and triage nurse.  Should you have questions after your visit or need to cancel or reschedule your appointment, please contact [place] [facility]  [phone] and follow the prompts.  Office hours are 8:00 a.m. to 4:30 p.m. Monday - Friday. Please note that voicemails left after 4:00 p.m. may not be returned until the following business day.  We are closed weekends and major holidays. You have access to a nurse at all times for urgent questions. Please call the main number to the clinic [phone] and follow the prompts.  For any non-urgent questions, you may also contact your provider using MyChart. We now offer e-Visits for anyone 34 and older to request care online for non-urgent symptoms. For details visit [email or web address].   Also download the MyChart app! Go to the app store, search "MyChart", open the app, select [place], and log in with your MyChart username and password.  Rituximab Injection What is this medication? RITUXIMAB (ri TUX i mab) treats leukemia and lymphoma. It works by blocking a protein that causes cancer cells to grow and multiply. This helps to slow or stop the spread of cancer cells. It may also be used to treat autoimmune conditions, such as arthritis. It works by slowing down an overactive immune system. It is a monoclonal antibody. This medicine may be used for other purposes; ask your health care provider or pharmacist if you have questions. COMMON BRAND NAME(S): RIABNI, Rituxan, RUXIENCE, truxima What should I tell my care team before I take this medication? They need to know if you have any of these conditions: Chest  pain Heart disease Immune system problems Infection, such as chickenpox, cold sores, hepatitis B, herpes Irregular heartbeat or rhythm Kidney disease Low blood counts, such as low white cells, platelets, red cells Lung disease Recent or upcoming vaccine An unusual or allergic reaction  to rituximab, other medications, foods, dyes, or preservatives Pregnant or trying to get pregnant Breast-feeding How should I use this medication? This medication is injected into a vein. It is given by a care team in a hospital or clinic setting. A special MedGuide will be given to you before each treatment. Be sure to read this information carefully each time. Talk to your care team about the use of this medication in children. While this medication may be prescribed for children as young as 6 months for selected conditions, precautions do apply. Overdosage: If you think you have taken too much of this medicine contact a poison control center or emergency room at once. NOTE: This medicine is only for you. Do not share this medicine with others. What if I miss a dose? Keep appointments for follow-up doses. It is important not to miss your dose. Call your care team if you are unable to keep an appointment. What may interact with this medication? Do not take this medication with any of the following: Live vaccines This medication may also interact with the following: Cisplatin This list may not describe all possible interactions. Give your health care provider a list of all the medicines, herbs, non-prescription drugs, or dietary supplements you use. Also tell them if you smoke, drink alcohol, or use illegal drugs. Some items may interact with your medicine. What should I watch for while using this medication? Your condition will be monitored carefully while you are receiving this medication. You may need blood work while taking this medication. This medication can cause serious infusion reactions. To reduce the risk your care team may give you other medications to take before receiving this one. Be sure to follow the directions from your care team. This medication may increase your risk of getting an infection. Call your care team for advice if you get a fever, chills, sore throat, or other  symptoms of a cold or flu. Do not treat yourself. Try to avoid being around people who are sick. Call your care team if you are around anyone with measles, chickenpox, or if you develop sores or blisters that do not heal properly. Avoid taking medications that contain aspirin, acetaminophen, ibuprofen, naproxen, or ketoprofen unless instructed by your care team. These medications may hide a fever. This medication may cause serious skin reactions. They can happen weeks to months after starting the medication. Contact your care team right away if you notice fevers or flu-like symptoms with a rash. The rash may be red or purple and then turn into blisters or peeling of the skin. You may also notice a red rash with swelling of the face, lips, or lymph nodes in your neck or under your arms. In some patients, this medication may cause a serious brain infection that may cause death. If you have any problems seeing, thinking, speaking, walking, or standing, tell your care team right away. If you cannot reach your care team, urgently seek another source of medical care. Talk to your care team if you may be pregnant. Serious birth defects can occur if you take this medication during pregnancy and for 12 months after the last dose. You will need a negative pregnancy test before starting this medication. Contraception is recommended while taking  this medication and for 12 months after the last dose. Your care team can help you find the option that works for you. Do not breastfeed while taking this medication and for at least 6 months after the last dose. What side effects may I notice from receiving this medication? Side effects that you should report to your care team as soon as possible: Allergic reactions or angioedema--skin rash, itching or hives, swelling of the face, eyes, lips, tongue, arms, or legs, trouble swallowing or breathing Bowel blockage--stomach cramping, unable to have a bowel movement or pass gas,  loss of appetite, vomiting Dizziness, loss of balance or coordination, confusion or trouble speaking Heart attack--pain or tightness in the chest, shoulders, arms, or jaw, nausea, shortness of breath, cold or clammy skin, feeling faint or lightheaded Heart rhythm changes--fast or irregular heartbeat, dizziness, feeling faint or lightheaded, chest pain, trouble breathing Infection--fever, chills, cough, sore throat, wounds that don't heal, pain or trouble when passing urine, general feeling of discomfort or being unwell Infusion reactions--chest pain, shortness of breath or trouble breathing, feeling faint or lightheaded Kidney injury--decrease in the amount of urine, swelling of the ankles, hands, or feet Liver injury--right upper belly pain, loss of appetite, nausea, light-colored stool, dark yellow or brown urine, yellowing skin or eyes, unusual weakness or fatigue Redness, blistering, peeling, or loosening of the skin, including inside the mouth Stomach pain that is severe, does not go away, or gets worse Tumor lysis syndrome (TLS)--nausea, vomiting, diarrhea, decrease in the amount of urine, dark urine, unusual weakness or fatigue, confusion, muscle pain or cramps, fast or irregular heartbeat, joint pain Side effects that usually do not require medical attention (report to your care team if they continue or are bothersome): Headache Joint pain Nausea Runny or stuffy nose Unusual weakness or fatigue This list may not describe all possible side effects. Call your doctor for medical advice about side effects. You may report side effects to FDA at 1-800-FDA-1088. Where should I keep my medication? This medication is given in a hospital or clinic. It will not be stored at home. NOTE: This sheet is a summary. It may not cover all possible information. If you have questions about this medicine, talk to your doctor, pharmacist, or health care provider.  2024 Elsevier/Gold Standard (2022-02-14  00:00:00)

## 2023-04-18 NOTE — Progress Notes (Signed)
Patient states that she is feeling more fatigued, and yesterday while she was walking her dog she was short of breath.

## 2023-04-21 ENCOUNTER — Encounter: Payer: Self-pay | Admitting: Internal Medicine

## 2023-04-21 ENCOUNTER — Telehealth: Payer: Self-pay

## 2023-04-21 NOTE — Telephone Encounter (Signed)
Follow up call to patient after 1st infusion.   Patient states she had been short of breath over weekend and she may have to receive chemotherapy.   Informed patient if she had to get chemo she would need to come to a chemo class as I gave her an abbreviated version over the phone due to only receiving Rituxan.  Patient verbalized understanding.

## 2023-04-21 NOTE — Addendum Note (Signed)
Addended byMichaelyn Barter on: 04/21/2023 04:21 PM   Modules accepted: Orders

## 2023-04-21 NOTE — Addendum Note (Signed)
Addended byMichaelyn Barter on: 04/21/2023 01:25 PM   Modules accepted: Orders

## 2023-04-21 NOTE — Progress Notes (Signed)
DISCONTINUE ON PATHWAY REGIMEN - Lymphoma and CLL     A cycle is every 28 days:     Rituximab-xxxx      Bendamustine   **Always confirm dose/schedule in your pharmacy ordering system**  REASON: Other Reason PRIOR TREATMENT: [id number]: Bendamustine + Rituximab IV (90/375) q28 Days x 6 Cycles TREATMENT RESPONSE: Unable to Evaluate  START ON PATHWAY REGIMEN - Lymphoma and CLL     A cycle is every 28 days:     Rituximab-xxxx      Bendamustine   **Always confirm dose/schedule in your pharmacy ordering system**  Patient Characteristics: Follicular Lymphoma, Grades 1, 2, and 3A, First Line, Stage III / IV, Symptomatic or Bulky Disease Disease Type: Follicular Lymphoma, Grade 1, 2, or 3A Disease Type: Not Applicable Disease Type: Not Applicable Line of Therapy: First Line Disease Characteristics: Symptomatic or Bulky Disease Intent of Therapy: Non-Curative / Palliative Intent, Discussed with Patient

## 2023-04-21 NOTE — Progress Notes (Signed)
DISCONTINUE ON PATHWAY REGIMEN - Lymphoma and CLL     A cycle is every 7 days:     Rituximab-xxxx   **Always confirm dose/schedule in your pharmacy ordering system**  REASON: Other Reason PRIOR TREATMENT: [id number]: Rituximab 375 mg/m2 IV Weekly x 4 Weeks TREATMENT RESPONSE: Unable to Evaluate  START ON PATHWAY REGIMEN - Lymphoma and CLL     A cycle is every 28 days:     Rituximab-xxxx      Bendamustine   **Always confirm dose/schedule in your pharmacy ordering system**  Patient Characteristics: Follicular Lymphoma, Grades 1, 2, and 3A, First Line, Stage III / IV, Symptomatic or Bulky Disease Disease Type: Follicular Lymphoma, Grade 1, 2, or 3A Disease Type: Not Applicable Disease Type: Not Applicable Line of Therapy: First Line Disease Characteristics: Symptomatic or Bulky Disease Intent of Therapy: Non-Curative / Palliative Intent, Discussed with Patient

## 2023-04-21 NOTE — Addendum Note (Signed)
Addended byMichaelyn Barter on: 04/21/2023 03:34 PM   Modules accepted: Orders

## 2023-04-23 MED FILL — Dexamethasone Sodium Phosphate Inj 100 MG/10ML: INTRAMUSCULAR | Qty: 1 | Status: AC

## 2023-04-24 ENCOUNTER — Inpatient Hospital Stay: Payer: Medicaid Other

## 2023-04-24 ENCOUNTER — Inpatient Hospital Stay (HOSPITAL_BASED_OUTPATIENT_CLINIC_OR_DEPARTMENT_OTHER): Payer: Medicaid Other | Admitting: Internal Medicine

## 2023-04-24 VITALS — BP 108/70 | HR 82 | Ht 63.0 in | Wt 134.0 lb

## 2023-04-24 VITALS — BP 86/67 | HR 98 | Temp 97.6°F | Wt 134.2 lb

## 2023-04-24 DIAGNOSIS — C8298 Follicular lymphoma, unspecified, lymph nodes of multiple sites: Secondary | ICD-10-CM

## 2023-04-24 DIAGNOSIS — Z5112 Encounter for antineoplastic immunotherapy: Secondary | ICD-10-CM | POA: Diagnosis not present

## 2023-04-24 DIAGNOSIS — Z5111 Encounter for antineoplastic chemotherapy: Secondary | ICD-10-CM

## 2023-04-24 DIAGNOSIS — R61 Generalized hyperhidrosis: Secondary | ICD-10-CM | POA: Diagnosis not present

## 2023-04-24 LAB — CBC WITH DIFFERENTIAL (CANCER CENTER ONLY)
Abs Immature Granulocytes: 0.02 10*3/uL (ref 0.00–0.07)
Basophils Absolute: 0.1 10*3/uL (ref 0.0–0.1)
Basophils Relative: 2 %
Eosinophils Absolute: 0.1 10*3/uL (ref 0.0–0.5)
Eosinophils Relative: 1 %
HCT: 40.3 % (ref 36.0–46.0)
Hemoglobin: 13.6 g/dL (ref 12.0–15.0)
Immature Granulocytes: 0 %
Lymphocytes Relative: 21 %
Lymphs Abs: 1.3 10*3/uL (ref 0.7–4.0)
MCH: 32.1 pg (ref 26.0–34.0)
MCHC: 33.7 g/dL (ref 30.0–36.0)
MCV: 95 fL (ref 80.0–100.0)
Monocytes Absolute: 0.6 10*3/uL (ref 0.1–1.0)
Monocytes Relative: 9 %
Neutro Abs: 4.1 10*3/uL (ref 1.7–7.7)
Neutrophils Relative %: 67 %
Platelet Count: 182 10*3/uL (ref 150–400)
RBC: 4.24 MIL/uL (ref 3.87–5.11)
RDW: 13 % (ref 11.5–15.5)
WBC Count: 6.1 10*3/uL (ref 4.0–10.5)
nRBC: 0 % (ref 0.0–0.2)

## 2023-04-24 LAB — CMP (CANCER CENTER ONLY)
ALT: 14 U/L (ref 0–44)
AST: 21 U/L (ref 15–41)
Albumin: 3.8 g/dL (ref 3.5–5.0)
Alkaline Phosphatase: 68 U/L (ref 38–126)
Anion gap: 8 (ref 5–15)
BUN: 11 mg/dL (ref 6–20)
CO2: 25 mmol/L (ref 22–32)
Calcium: 8.6 mg/dL — ABNORMAL LOW (ref 8.9–10.3)
Chloride: 103 mmol/L (ref 98–111)
Creatinine: 0.75 mg/dL (ref 0.44–1.00)
GFR, Estimated: >60 mL/min (ref >60)
Glucose, Bld: 107 mg/dL — ABNORMAL HIGH (ref 70–99)
Potassium: 4 mmol/L (ref 3.5–5.1)
Sodium: 136 mmol/L (ref 135–145)
Total Bilirubin: 0.5 mg/dL (ref 0.3–1.2)
Total Protein: 6.6 g/dL (ref 6.5–8.1)

## 2023-04-24 LAB — LACTATE DEHYDROGENASE: LDH: 123 U/L (ref 98–192)

## 2023-04-24 MED ORDER — DEXAMETHASONE 4 MG PO TABS: 8.0000 mg | ORAL_TABLET | Freq: Every day | ORAL | 1 refills | Status: DC

## 2023-04-24 MED ORDER — PROCHLORPERAZINE MALEATE 10 MG PO TABS: 10.0000 mg | ORAL_TABLET | Freq: Four times a day (QID) | ORAL | 0 refills | Status: DC | PRN

## 2023-04-24 MED ORDER — SODIUM CHLORIDE 0.9 % IV SOLN
Freq: Once | INTRAVENOUS | Status: AC
  Filled 2023-04-24: qty 250

## 2023-04-24 MED ORDER — ONDANSETRON HCL 8 MG PO TABS: 8.0000 mg | ORAL_TABLET | Freq: Three times a day (TID) | ORAL | 0 refills | Status: AC | PRN

## 2023-04-24 MED ORDER — ACYCLOVIR 400 MG PO TABS: 400.0000 mg | ORAL_TABLET | Freq: Every day | ORAL | 2 refills | Status: DC

## 2023-04-24 MED ORDER — SODIUM CHLORIDE 0.9 % IV SOLN
10.0000 mg | Freq: Once | INTRAVENOUS | Status: AC
  Administered 2023-04-24: 10 mg via INTRAVENOUS
  Filled 2023-04-24: qty 10

## 2023-04-24 MED ORDER — SODIUM CHLORIDE 0.9 % IV SOLN
90.0000 mg/m2 | Freq: Once | INTRAVENOUS | Status: AC
  Administered 2023-04-24: 150 mg via INTRAVENOUS
  Filled 2023-04-24: qty 6

## 2023-04-24 MED FILL — Dexamethasone Sodium Phosphate Inj 100 MG/10ML: INTRAMUSCULAR | Qty: 1 | Status: AC

## 2023-04-24 NOTE — Patient Instructions (Signed)
Hold lisinopril today Recheck you blood pressure tomorrow and you can resume only if BP> 100

## 2023-04-24 NOTE — Progress Notes (Signed)
Patient says that her shortness of breath has worsened. She had a little bit of nausea when she was getting her treatment.

## 2023-04-24 NOTE — Patient Instructions (Signed)
[facility]  Discharge Instructions: Thank you for choosing [facility] to provide your oncology and hematology care.  If you have a lab appointment with the [facility], please go directly to the [facility] and check in at the registration area.  Wear comfortable clothing and clothing appropriate for easy access to any Portacath or PICC line.   We strive to give you quality time with your provider. You may need to reschedule your appointment if you arrive late (15 or more minutes).  Arriving late affects you and other patients whose appointments are after yours.  Also, if you miss three or more appointments without notifying the office, you may be dismissed from the clinic at the provider's discretion.      For prescription refill requests, have your pharmacy contact our office and allow 72 hours for refills to be completed.    Today you received the following chemotherapy and/or immunotherapy agents- bendamustine      To help prevent nausea and vomiting after your treatment, we encourage you to take your nausea medication as directed.  BELOW ARE SYMPTOMS THAT SHOULD BE REPORTED IMMEDIATELY: *FEVER GREATER THAN 100.4 F (38 C) OR HIGHER *CHILLS OR SWEATING *NAUSEA AND VOMITING THAT IS NOT CONTROLLED WITH YOUR NAUSEA MEDICATION *UNUSUAL SHORTNESS OF BREATH *UNUSUAL BRUISING OR BLEEDING *URINARY PROBLEMS (pain or burning when urinating, or frequent urination) *BOWEL PROBLEMS (unusual diarrhea, constipation, pain near the anus) TENDERNESS IN MOUTH AND THROAT WITH OR WITHOUT PRESENCE OF ULCERS (sore throat, sores in mouth, or a toothache) UNUSUAL RASH, SWELLING OR PAIN  UNUSUAL VAGINAL DISCHARGE OR ITCHING   Items with * indicate a potential emergency and should be followed up as soon as possible or go to the Emergency Department if any problems should occur.  Please show the CHEMOTHERAPY ALERT CARD or IMMUNOTHERAPY ALERT CARD at check-in  to the Emergency Department and triage nurse.  Should you have questions after your visit or need to cancel or reschedule your appointment, please contact [facility]  [phone] and follow the prompts.  Office hours are 8:00 a.m. to 4:30 p.m. Monday - Friday. Please note that voicemails left after 4:00 p.m. may not be returned until the following business day.  We are closed weekends and major holidays. You have access to a nurse at all times for urgent questions. Please call the main number to the clinic [phone] and follow the prompts.  For any non-urgent questions, you may also contact your provider using MyChart. We now offer e-Visits for anyone 58 and older to request care online for non-urgent symptoms. For details visit [email or web address].   Also download the MyChart app! Go to the app store, search "MyChart", open the app, select [facility], and log in with your MyChart username and password.
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
## 2023-04-25 ENCOUNTER — Inpatient Hospital Stay: Payer: Medicaid Other

## 2023-04-25 ENCOUNTER — Inpatient Hospital Stay: Payer: Medicaid Other | Admitting: Internal Medicine

## 2023-04-25 ENCOUNTER — Encounter: Payer: Self-pay | Admitting: Internal Medicine

## 2023-04-25 NOTE — Addendum Note (Signed)
Addended byMichaelyn Barter on: 04/25/2023 04:01 PM   Modules accepted: Orders

## 2023-04-29 ENCOUNTER — Encounter: Payer: Self-pay | Admitting: Internal Medicine

## 2023-04-29 MED FILL — Dexamethasone Sodium Phosphate Inj 100 MG/10ML: INTRAMUSCULAR | Qty: 1 | Status: AC

## 2023-04-30 ENCOUNTER — Inpatient Hospital Stay: Payer: Medicaid Other

## 2023-04-30 VITALS — BP 118/74 | HR 64 | Temp 97.0°F | Resp 16

## 2023-04-30 DIAGNOSIS — Z5111 Encounter for antineoplastic chemotherapy: Secondary | ICD-10-CM | POA: Diagnosis not present

## 2023-04-30 DIAGNOSIS — C8298 Follicular lymphoma, unspecified, lymph nodes of multiple sites: Secondary | ICD-10-CM

## 2023-04-30 MED ORDER — SODIUM CHLORIDE 0.9 % IV SOLN
90.0000 mg/m2 | Freq: Once | INTRAVENOUS | Status: AC
  Administered 2023-04-30: 150 mg via INTRAVENOUS
  Filled 2023-04-30: qty 6

## 2023-04-30 MED ORDER — DIPHENHYDRAMINE HCL 25 MG PO CAPS
50.0000 mg | ORAL_CAPSULE | Freq: Once | ORAL | Status: AC
  Administered 2023-04-30: 50 mg via ORAL
  Filled 2023-04-30: qty 2

## 2023-04-30 MED ORDER — SODIUM CHLORIDE 0.9 % IV SOLN
10.0000 mg | Freq: Once | INTRAVENOUS | Status: AC
  Administered 2023-04-30: 10 mg via INTRAVENOUS
  Filled 2023-04-30: qty 1
  Filled 2023-04-30: qty 10

## 2023-04-30 MED ORDER — RITUXIMAB-HYALURONIDASE HUMAN 1400-23400 MG -UT/11.7ML ~~LOC~~ SOLN
1400.0000 mg | Freq: Once | SUBCUTANEOUS | Status: AC
  Administered 2023-04-30: 1400 mg via SUBCUTANEOUS
  Filled 2023-04-30: qty 11.7

## 2023-04-30 MED ORDER — ACETAMINOPHEN 325 MG PO TABS
650.0000 mg | ORAL_TABLET | Freq: Once | ORAL | Status: AC
  Administered 2023-04-30: 650 mg via ORAL
  Filled 2023-04-30: qty 2

## 2023-04-30 MED ORDER — PALONOSETRON HCL INJECTION 0.25 MG/5ML
0.2500 mg | Freq: Once | INTRAVENOUS | Status: AC
  Administered 2023-04-30: 0.25 mg via INTRAVENOUS
  Filled 2023-04-30: qty 5

## 2023-04-30 MED ORDER — SODIUM CHLORIDE 0.9 % IV SOLN
Freq: Once | INTRAVENOUS | Status: AC
  Filled 2023-04-30: qty 250

## 2023-04-30 NOTE — Patient Instructions (Signed)
[facility]  Discharge Instructions: Thank you for choosing [facility] to provide your oncology and hematology care.  If you have a lab appointment with the Cancer Center, please go directly to the Cancer Center and check in at the registration area.  Wear comfortable clothing and clothing appropriate for easy access to any Portacath or PICC line.   We strive to give you quality time with your provider. You may need to reschedule your appointment if you arrive late (15 or more minutes).  Arriving late affects you and other patients whose appointments are after yours.  Also, if you miss three or more appointments without notifying the office, you may be dismissed from the clinic at the provider's discretion.      For prescription refill requests, have your pharmacy contact our office and allow 72 hours for refills to be completed.    Today you received the following chemotherapy and/or immunotherapy agents- Rituximab, Bendamustine      To help prevent nausea and vomiting after your treatment, we encourage you to take your nausea medication as directed.  BELOW ARE SYMPTOMS THAT SHOULD BE REPORTED IMMEDIATELY: *FEVER GREATER THAN 100.4 F (38 C) OR HIGHER *CHILLS OR SWEATING *NAUSEA AND VOMITING THAT IS NOT CONTROLLED WITH YOUR NAUSEA MEDICATION *UNUSUAL SHORTNESS OF BREATH *UNUSUAL BRUISING OR BLEEDING *URINARY PROBLEMS (pain or burning when urinating, or frequent urination) *BOWEL PROBLEMS (unusual diarrhea, constipation, pain near the anus) TENDERNESS IN MOUTH AND THROAT WITH OR WITHOUT PRESENCE OF ULCERS (sore throat, sores in mouth, or a toothache) UNUSUAL RASH, SWELLING OR PAIN  UNUSUAL VAGINAL DISCHARGE OR ITCHING   Items with * indicate a potential emergency and should be followed up as soon as possible or go to the Emergency Department if any problems should occur.  Please show the CHEMOTHERAPY ALERT CARD or IMMUNOTHERAPY ALERT CARD  at check-in to the Emergency Department and triage nurse.  Should you have questions after your visit or need to cancel or reschedule your appointment, please contact [facility]  [phone] and follow the prompts.  Office hours are 8:00 a.m. to 4:30 p.m. Monday - Friday. Please note that voicemails left after 4:00 p.m. may not be returned until the following business day.  We are closed weekends and major holidays. You have access to a nurse at all times for urgent questions. Please call the main number to the clinic [phone] and follow the prompts.  For any non-urgent questions, you may also contact your provider using MyChart. We now offer e-Visits for anyone 75 and older to request care online for non-urgent symptoms. For details visit [email or web address].   Also download the MyChart app! Go to the app store, search "MyChart", open the app, select [facility], and log in with your MyChart username and password.

## 2023-05-01 ENCOUNTER — Ambulatory Visit: Payer: Medicaid Other

## 2023-05-02 ENCOUNTER — Ambulatory Visit: Payer: Medicaid Other

## 2023-05-02 ENCOUNTER — Ambulatory Visit: Payer: Medicaid Other | Admitting: Internal Medicine

## 2023-05-07 ENCOUNTER — Ambulatory Visit
Admission: RE | Admit: 2023-05-07 | Discharge: 2023-05-07 | Disposition: A | Discharge status: Home / Self Care | Payer: Medicaid Other | Source: Ambulatory Visit | Attending: Internal Medicine | Admitting: Internal Medicine

## 2023-05-07 DIAGNOSIS — C8298 Follicular lymphoma, unspecified, lymph nodes of multiple sites: Secondary | ICD-10-CM | POA: Insufficient documentation

## 2023-05-07 MED ORDER — IOHEXOL 300 MG/ML  SOLN
75.0000 mL | Freq: Once | INTRAMUSCULAR | Status: AC | PRN
  Administered 2023-05-07: 75 mL via INTRAVENOUS

## 2023-05-08 ENCOUNTER — Telehealth: Payer: Self-pay

## 2023-05-08 NOTE — Telephone Encounter (Signed)
Patient called because she has had her CT scan done and she would like to know the results but I stated to her we have been released the results yet. Patient states she understand. Patient states she is having some shortness of breathe. She states she unable to make short distances without becoming short of breathe.

## 2023-05-09 ENCOUNTER — Other Ambulatory Visit: Payer: Self-pay | Admitting: Internal Medicine

## 2023-05-09 ENCOUNTER — Ambulatory Visit: Payer: Medicaid Other

## 2023-05-09 ENCOUNTER — Other Ambulatory Visit: Payer: Self-pay | Admitting: *Deleted

## 2023-05-09 ENCOUNTER — Ambulatory Visit: Payer: Medicaid Other | Admitting: Internal Medicine

## 2023-05-09 DIAGNOSIS — R0602 Shortness of breath: Secondary | ICD-10-CM

## 2023-05-09 MED ORDER — ALBUTEROL SULFATE HFA 108 (90 BASE) MCG/ACT IN AERS: 2.0000 | INHALATION_SPRAY | Freq: Four times a day (QID) | RESPIRATORY_TRACT | 0 refills | Status: DC | PRN

## 2023-05-13 ENCOUNTER — Encounter: Payer: Self-pay | Admitting: Internal Medicine

## 2023-05-19 ENCOUNTER — Other Ambulatory Visit: Payer: Self-pay | Admitting: Internal Medicine

## 2023-05-21 MED FILL — Dexamethasone Sodium Phosphate Inj 100 MG/10ML: INTRAMUSCULAR | Qty: 1 | Status: AC

## 2023-05-22 ENCOUNTER — Inpatient Hospital Stay (HOSPITAL_BASED_OUTPATIENT_CLINIC_OR_DEPARTMENT_OTHER): Payer: Medicaid Other | Admitting: Internal Medicine

## 2023-05-22 ENCOUNTER — Other Ambulatory Visit: Payer: Self-pay

## 2023-05-22 ENCOUNTER — Inpatient Hospital Stay: Payer: Medicaid Other | Attending: Internal Medicine

## 2023-05-22 ENCOUNTER — Inpatient Hospital Stay: Payer: Medicaid Other

## 2023-05-22 ENCOUNTER — Encounter: Payer: Self-pay | Admitting: Internal Medicine

## 2023-05-22 VITALS — BP 92/72 | HR 80 | Temp 97.7°F | Resp 18 | Wt 130.9 lb

## 2023-05-22 VITALS — BP 109/63 | HR 60 | Resp 18

## 2023-05-22 DIAGNOSIS — Z79899 Other long term (current) drug therapy: Secondary | ICD-10-CM | POA: Insufficient documentation

## 2023-05-22 DIAGNOSIS — Z5111 Encounter for antineoplastic chemotherapy: Secondary | ICD-10-CM | POA: Insufficient documentation

## 2023-05-22 DIAGNOSIS — C8200 Follicular lymphoma grade I, unspecified site: Secondary | ICD-10-CM | POA: Insufficient documentation

## 2023-05-22 DIAGNOSIS — C8298 Follicular lymphoma, unspecified, lymph nodes of multiple sites: Secondary | ICD-10-CM

## 2023-05-22 LAB — CMP (CANCER CENTER ONLY)
ALT: 16 U/L (ref 0–44)
AST: 21 U/L (ref 15–41)
Albumin: 4 g/dL (ref 3.5–5.0)
Alkaline Phosphatase: 69 U/L (ref 38–126)
Anion gap: 6 (ref 5–15)
BUN: 9 mg/dL (ref 8–23)
CO2: 27 mmol/L (ref 22–32)
Calcium: 8.9 mg/dL (ref 8.9–10.3)
Chloride: 104 mmol/L (ref 98–111)
Creatinine: 0.67 mg/dL (ref 0.44–1.00)
GFR, Estimated: >60 mL/min (ref >60)
Glucose, Bld: 79 mg/dL (ref 70–99)
Potassium: 4.7 mmol/L (ref 3.5–5.1)
Sodium: 137 mmol/L (ref 135–145)
Total Bilirubin: 0.4 mg/dL (ref 0.3–1.2)
Total Protein: 6.8 g/dL (ref 6.5–8.1)

## 2023-05-22 LAB — CBC WITH DIFFERENTIAL (CANCER CENTER ONLY)
Abs Immature Granulocytes: 0.02 10*3/uL (ref 0.00–0.07)
Basophils Absolute: 0.1 10*3/uL (ref 0.0–0.1)
Basophils Relative: 2 %
Eosinophils Absolute: 0.1 10*3/uL (ref 0.0–0.5)
Eosinophils Relative: 2 %
HCT: 41.6 % (ref 36.0–46.0)
Hemoglobin: 13.8 g/dL (ref 12.0–15.0)
Immature Granulocytes: 0 %
Lymphocytes Relative: 10 %
Lymphs Abs: 0.6 10*3/uL — ABNORMAL LOW (ref 0.7–4.0)
MCH: 32.2 pg (ref 26.0–34.0)
MCHC: 33.2 g/dL (ref 30.0–36.0)
MCV: 97 fL (ref 80.0–100.0)
Monocytes Absolute: 0.6 10*3/uL (ref 0.1–1.0)
Monocytes Relative: 11 %
Neutro Abs: 4.3 10*3/uL (ref 1.7–7.7)
Neutrophils Relative %: 75 %
Platelet Count: 173 10*3/uL (ref 150–400)
RBC: 4.29 MIL/uL (ref 3.87–5.11)
RDW: 13.3 % (ref 11.5–15.5)
WBC Count: 5.7 10*3/uL (ref 4.0–10.5)
nRBC: 0 % (ref 0.0–0.2)

## 2023-05-22 LAB — URIC ACID: Uric Acid, Serum: 4.1 mg/dL (ref 2.5–7.1)

## 2023-05-22 LAB — LACTATE DEHYDROGENASE: LDH: 123 U/L (ref 98–192)

## 2023-05-22 MED ORDER — SODIUM CHLORIDE 0.9 % IV SOLN
Freq: Once | INTRAVENOUS | Status: AC
  Filled 2023-05-22: qty 250

## 2023-05-22 MED ORDER — PALONOSETRON HCL INJECTION 0.25 MG/5ML
0.2500 mg | Freq: Once | INTRAVENOUS | Status: AC
  Administered 2023-05-22: 0.25 mg via INTRAVENOUS
  Filled 2023-05-22: qty 5

## 2023-05-22 MED ORDER — SODIUM CHLORIDE 0.9 % IV SOLN
90.0000 mg/m2 | Freq: Once | INTRAVENOUS | Status: AC
  Administered 2023-05-22: 150 mg via INTRAVENOUS
  Filled 2023-05-22: qty 6

## 2023-05-22 MED ORDER — RITUXIMAB-HYALURONIDASE HUMAN 1400-23400 MG -UT/11.7ML ~~LOC~~ SOLN
1400.0000 mg | Freq: Once | SUBCUTANEOUS | Status: AC
  Administered 2023-05-22: 1400 mg via SUBCUTANEOUS
  Filled 2023-05-22: qty 11.7

## 2023-05-22 MED ORDER — DIPHENHYDRAMINE HCL 25 MG PO CAPS
50.0000 mg | ORAL_CAPSULE | Freq: Once | ORAL | Status: AC
  Administered 2023-05-22: 50 mg via ORAL
  Filled 2023-05-22: qty 2

## 2023-05-22 MED ORDER — SODIUM CHLORIDE 0.9 % IV SOLN
10.0000 mg | Freq: Once | INTRAVENOUS | Status: AC
  Administered 2023-05-22: 10 mg via INTRAVENOUS
  Filled 2023-05-22: qty 10

## 2023-05-22 MED ORDER — ACETAMINOPHEN 325 MG PO TABS
650.0000 mg | ORAL_TABLET | Freq: Once | ORAL | Status: AC
  Administered 2023-05-22: 650 mg via ORAL
  Filled 2023-05-22: qty 2

## 2023-05-22 MED FILL — Dexamethasone Sodium Phosphate Inj 100 MG/10ML: INTRAMUSCULAR | Qty: 1 | Status: AC
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
## 2023-05-22 NOTE — Progress Notes (Signed)
Patient has no concerns today. 

## 2023-05-23 ENCOUNTER — Inpatient Hospital Stay: Payer: Medicaid Other

## 2023-05-23 VITALS — BP 105/63 | HR 66 | Temp 97.9°F | Wt 132.2 lb

## 2023-05-23 DIAGNOSIS — C8298 Follicular lymphoma, unspecified, lymph nodes of multiple sites: Secondary | ICD-10-CM

## 2023-05-23 DIAGNOSIS — Z5111 Encounter for antineoplastic chemotherapy: Secondary | ICD-10-CM | POA: Diagnosis not present

## 2023-05-23 MED ORDER — SODIUM CHLORIDE 0.9 % IV SOLN
90.0000 mg/m2 | Freq: Once | INTRAVENOUS | Status: AC
  Administered 2023-05-23: 150 mg via INTRAVENOUS
  Filled 2023-05-23: qty 6

## 2023-05-23 MED ORDER — SODIUM CHLORIDE 0.9 % IV SOLN
Freq: Once | INTRAVENOUS | Status: AC
  Filled 2023-05-23: qty 250

## 2023-05-23 MED ORDER — SODIUM CHLORIDE 0.9 % IV SOLN
10.0000 mg | Freq: Once | INTRAVENOUS | Status: AC
  Administered 2023-05-23: 10 mg via INTRAVENOUS
  Filled 2023-05-23: qty 10

## 2023-05-23 NOTE — Patient Instructions (Signed)

## 2023-05-24 ENCOUNTER — Other Ambulatory Visit: Payer: Self-pay

## 2023-05-26 ENCOUNTER — Ambulatory Visit: Admission: RE | Admit: 2023-05-26 | Payer: Medicaid Other | Source: Ambulatory Visit

## 2023-05-30 ENCOUNTER — Ambulatory Visit: Payer: Medicaid Other | Admitting: Internal Medicine

## 2023-05-30 ENCOUNTER — Other Ambulatory Visit: Payer: Medicaid Other

## 2023-06-12 ENCOUNTER — Encounter: Payer: Self-pay | Admitting: Internal Medicine

## 2023-06-13 ENCOUNTER — Encounter: Payer: Self-pay | Admitting: Internal Medicine

## 2023-06-16 ENCOUNTER — Encounter: Payer: Self-pay | Admitting: Student in an Organized Health Care Education/Training Program

## 2023-06-16 ENCOUNTER — Ambulatory Visit: Payer: Medicaid Other | Admitting: Student in an Organized Health Care Education/Training Program

## 2023-06-16 VITALS — BP 100/72 | HR 72 | Temp 97.6°F | Ht 63.0 in | Wt 133.8 lb

## 2023-06-16 DIAGNOSIS — Z23 Encounter for immunization: Secondary | ICD-10-CM

## 2023-06-16 DIAGNOSIS — R0602 Shortness of breath: Secondary | ICD-10-CM

## 2023-06-16 LAB — NITRIC OXIDE: Nitric Oxide: 10
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
## 2023-06-18 ENCOUNTER — Encounter: Payer: Self-pay | Admitting: Internal Medicine

## 2023-06-18 ENCOUNTER — Other Ambulatory Visit: Payer: Self-pay

## 2023-06-18 MED FILL — Dexamethasone Sodium Phosphate Inj 100 MG/10ML: INTRAMUSCULAR | Qty: 1 | Status: AC

## 2023-06-19 ENCOUNTER — Inpatient Hospital Stay: Payer: Medicaid Other | Attending: Internal Medicine

## 2023-06-19 ENCOUNTER — Inpatient Hospital Stay (HOSPITAL_BASED_OUTPATIENT_CLINIC_OR_DEPARTMENT_OTHER): Payer: Medicaid Other | Admitting: Internal Medicine

## 2023-06-19 ENCOUNTER — Encounter: Payer: Self-pay | Admitting: Internal Medicine

## 2023-06-19 ENCOUNTER — Inpatient Hospital Stay: Payer: Medicaid Other

## 2023-06-19 VITALS — BP 103/66 | HR 66 | Temp 96.6°F | Wt 133.0 lb

## 2023-06-19 VITALS — BP 110/73 | HR 64 | Temp 96.2°F | Resp 18

## 2023-06-19 DIAGNOSIS — Z5111 Encounter for antineoplastic chemotherapy: Secondary | ICD-10-CM

## 2023-06-19 DIAGNOSIS — Z79899 Other long term (current) drug therapy: Secondary | ICD-10-CM | POA: Insufficient documentation

## 2023-06-19 DIAGNOSIS — R21 Rash and other nonspecific skin eruption: Secondary | ICD-10-CM | POA: Diagnosis not present

## 2023-06-19 DIAGNOSIS — C8298 Follicular lymphoma, unspecified, lymph nodes of multiple sites: Secondary | ICD-10-CM

## 2023-06-19 DIAGNOSIS — Z5112 Encounter for antineoplastic immunotherapy: Secondary | ICD-10-CM | POA: Diagnosis not present

## 2023-06-19 DIAGNOSIS — C8200 Follicular lymphoma grade I, unspecified site: Secondary | ICD-10-CM | POA: Insufficient documentation

## 2023-06-19 LAB — CBC WITH DIFFERENTIAL (CANCER CENTER ONLY)
Abs Immature Granulocytes: 0.01 10*3/uL (ref 0.00–0.07)
Basophils Absolute: 0.1 10*3/uL (ref 0.0–0.1)
Basophils Relative: 2 %
Eosinophils Absolute: 0.2 10*3/uL (ref 0.0–0.5)
Eosinophils Relative: 3 %
HCT: 40.2 % (ref 36.0–46.0)
Hemoglobin: 13.4 g/dL (ref 12.0–15.0)
Immature Granulocytes: 0 %
Lymphocytes Relative: 10 %
Lymphs Abs: 0.5 10*3/uL — ABNORMAL LOW (ref 0.7–4.0)
MCH: 32.8 pg (ref 26.0–34.0)
MCHC: 33.3 g/dL (ref 30.0–36.0)
MCV: 98.3 fL (ref 80.0–100.0)
Monocytes Absolute: 0.7 10*3/uL (ref 0.1–1.0)
Monocytes Relative: 14 %
Neutro Abs: 3.3 10*3/uL (ref 1.7–7.7)
Neutrophils Relative %: 71 %
Platelet Count: 150 10*3/uL (ref 150–400)
RBC: 4.09 MIL/uL (ref 3.87–5.11)
RDW: 13.5 % (ref 11.5–15.5)
WBC Count: 4.7 10*3/uL (ref 4.0–10.5)
nRBC: 0 % (ref 0.0–0.2)

## 2023-06-19 LAB — CMP (CANCER CENTER ONLY)
ALT: 20 U/L (ref 0–44)
AST: 25 U/L (ref 15–41)
Albumin: 3.9 g/dL (ref 3.5–5.0)
Alkaline Phosphatase: 68 U/L (ref 38–126)
Anion gap: 4 — ABNORMAL LOW (ref 5–15)
BUN: 9 mg/dL (ref 8–23)
CO2: 28 mmol/L (ref 22–32)
Calcium: 8.7 mg/dL — ABNORMAL LOW (ref 8.9–10.3)
Chloride: 105 mmol/L (ref 98–111)
Creatinine: 0.61 mg/dL (ref 0.44–1.00)
GFR, Estimated: >60 mL/min (ref >60)
Glucose, Bld: 66 mg/dL — ABNORMAL LOW (ref 70–99)
Potassium: 3.6 mmol/L (ref 3.5–5.1)
Sodium: 137 mmol/L (ref 135–145)
Total Bilirubin: 0.3 mg/dL (ref 0.3–1.2)
Total Protein: 6.7 g/dL (ref 6.5–8.1)

## 2023-06-19 MED ORDER — SODIUM CHLORIDE 0.9 % IV SOLN
10.0000 mg | Freq: Once | INTRAVENOUS | Status: AC
  Administered 2023-06-19: 10 mg via INTRAVENOUS
  Filled 2023-06-19: qty 10

## 2023-06-19 MED ORDER — PREDNISONE 10 MG PO TABS: ORAL_TABLET | ORAL | 0 refills | Status: AC

## 2023-06-19 MED ORDER — ACETAMINOPHEN 325 MG PO TABS
650.0000 mg | ORAL_TABLET | Freq: Once | ORAL | Status: AC
  Administered 2023-06-19: 650 mg via ORAL
  Filled 2023-06-19: qty 2

## 2023-06-19 MED ORDER — RITUXIMAB-HYALURONIDASE HUMAN 1400-23400 MG -UT/11.7ML ~~LOC~~ SOLN
1400.0000 mg | Freq: Once | SUBCUTANEOUS | Status: AC
  Administered 2023-06-19: 1400 mg via SUBCUTANEOUS
  Filled 2023-06-19 (×2): qty 11.7

## 2023-06-19 MED ORDER — SODIUM CHLORIDE 0.9 % IV SOLN
Freq: Once | INTRAVENOUS | Status: AC
  Filled 2023-06-19: qty 250

## 2023-06-19 MED ORDER — PALONOSETRON HCL INJECTION 0.25 MG/5ML
0.2500 mg | Freq: Once | INTRAVENOUS | Status: AC
  Administered 2023-06-19: 0.25 mg via INTRAVENOUS
  Filled 2023-06-19: qty 5

## 2023-06-19 MED ORDER — DIPHENHYDRAMINE HCL 25 MG PO CAPS
50.0000 mg | ORAL_CAPSULE | Freq: Once | ORAL | Status: AC
  Administered 2023-06-19: 50 mg via ORAL
  Filled 2023-06-19: qty 2

## 2023-06-19 MED ORDER — SODIUM CHLORIDE 0.9 % IV SOLN
90.0000 mg/m2 | Freq: Once | INTRAVENOUS | Status: AC
  Administered 2023-06-19: 150 mg via INTRAVENOUS
  Filled 2023-06-19: qty 6

## 2023-06-19 MED FILL — Dexamethasone Sodium Phosphate Inj 100 MG/10ML: INTRAMUSCULAR | Qty: 1 | Status: AC
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
## 2023-06-19 NOTE — Patient Instructions (Addendum)
[place] [facility]  Discharge Instructions: Thank you for choosing [facility] to provide your oncology and hematology care.  If you have a lab appointment with the Cancer Center, please go directly to the Cancer Center and check in at the registration area.  Wear comfortable clothing and clothing appropriate for easy access to any Portacath or PICC line.   We strive to give you quality time with your provider. You may need to reschedule your appointment if you arrive late (15 or more minutes).  Arriving late affects you and other patients whose appointments are after yours.  Also, if you miss three or more appointments without notifying the office, you may be dismissed from the clinic at the provider's discretion.      For prescription refill requests, have your pharmacy contact our office and allow 72 hours for refills to be completed.    Today you received the following chemotherapy and/or immunotherapy agents Rituximab and Bendamustine.       To help prevent nausea and vomiting after your treatment, we encourage you to take your nausea medication as directed.  BELOW ARE SYMPTOMS THAT SHOULD BE REPORTED IMMEDIATELY: *FEVER GREATER THAN 100.4 F (38 C) OR HIGHER *CHILLS OR SWEATING *NAUSEA AND VOMITING THAT IS NOT CONTROLLED WITH YOUR NAUSEA MEDICATION *UNUSUAL SHORTNESS OF BREATH *UNUSUAL BRUISING OR BLEEDING *URINARY PROBLEMS (pain or burning when urinating, or frequent urination) *BOWEL PROBLEMS (unusual diarrhea, constipation, pain near the anus) TENDERNESS IN MOUTH AND THROAT WITH OR WITHOUT PRESENCE OF ULCERS (sore throat, sores in mouth, or a toothache) UNUSUAL RASH, SWELLING OR PAIN  UNUSUAL VAGINAL DISCHARGE OR ITCHING   Items with * indicate a potential emergency and should be followed up as soon as possible or go to the Emergency Department if any problems should occur.  Please show the CHEMOTHERAPY ALERT CARD or IMMUNOTHERAPY ALERT  CARD at check-in to the Emergency Department and triage nurse.  Should you have questions after your visit or need to cancel or reschedule your appointment, please contact [place] [facility]  [phone] and follow the prompts.  Office hours are 8:00 a.m. to 4:30 p.m. Monday - Friday. Please note that voicemails left after 4:00 p.m. may not be returned until the following business day.  We are closed weekends and major holidays. You have access to a nurse at all times for urgent questions. Please call the main number to the clinic [phone] and follow the prompts.  For any non-urgent questions, you may also contact your provider using MyChart. We now offer e-Visits for anyone 71 and older to request care online for non-urgent symptoms. For details visit [email or web address].   Also download the MyChart app! Go to the app store, search "MyChart", open the app, select , and log in with your MyChart username and password.

## 2023-06-20 ENCOUNTER — Encounter: Payer: Self-pay | Admitting: Internal Medicine

## 2023-06-20 ENCOUNTER — Ambulatory Visit: Payer: Medicaid Other

## 2023-06-20 ENCOUNTER — Inpatient Hospital Stay: Payer: Medicaid Other

## 2023-06-20 VITALS — BP 110/67 | HR 61 | Temp 97.6°F | Resp 17

## 2023-06-20 DIAGNOSIS — C8298 Follicular lymphoma, unspecified, lymph nodes of multiple sites: Secondary | ICD-10-CM

## 2023-06-20 DIAGNOSIS — Z5111 Encounter for antineoplastic chemotherapy: Secondary | ICD-10-CM | POA: Diagnosis not present

## 2023-06-20 MED ORDER — SODIUM CHLORIDE 0.9% FLUSH
10.0000 mL | INTRAVENOUS | Status: DC | PRN
  Administered 2023-06-20: 10 mL
  Filled 2023-06-20: qty 10

## 2023-06-20 MED ORDER — SODIUM CHLORIDE 0.9 % IV SOLN
10.0000 mg | Freq: Once | INTRAVENOUS | Status: AC
  Administered 2023-06-20: 10 mg via INTRAVENOUS
  Filled 2023-06-20: qty 10

## 2023-06-20 MED ORDER — SODIUM CHLORIDE 0.9 % IV SOLN
Freq: Once | INTRAVENOUS | Status: AC
  Filled 2023-06-20: qty 250

## 2023-06-20 MED ORDER — SODIUM CHLORIDE 0.9 % IV SOLN
90.0000 mg/m2 | Freq: Once | INTRAVENOUS | Status: AC
  Administered 2023-06-20: 150 mg via INTRAVENOUS
  Filled 2023-06-20: qty 6

## 2023-06-20 NOTE — Patient Instructions (Signed)
[place] [facility]  Discharge Instructions: Thank you for choosing [facility] to provide your oncology and hematology care.  If you have a lab appointment with the Cancer Center, please go directly to the Cancer Center and check in at the registration area.  Wear comfortable clothing and clothing appropriate for easy access to any Portacath or PICC line.   We strive to give you quality time with your provider. You may need to reschedule your appointment if you arrive late (15 or more minutes).  Arriving late affects you and other patients whose appointments are after yours.  Also, if you miss three or more appointments without notifying the office, you may be dismissed from the clinic at the provider's discretion.      For prescription refill requests, have your pharmacy contact our office and allow 72 hours for refills to be completed.    Today you received the following chemotherapy and/or immunotherapy agents: bendamustine    To help prevent nausea and vomiting after your treatment, we encourage you to take your nausea medication as directed.  BELOW ARE SYMPTOMS THAT SHOULD BE REPORTED IMMEDIATELY: *FEVER GREATER THAN 100.4 F (38 C) OR HIGHER *CHILLS OR SWEATING *NAUSEA AND VOMITING THAT IS NOT CONTROLLED WITH YOUR NAUSEA MEDICATION *UNUSUAL SHORTNESS OF BREATH *UNUSUAL BRUISING OR BLEEDING *URINARY PROBLEMS (pain or burning when urinating, or frequent urination) *BOWEL PROBLEMS (unusual diarrhea, constipation, pain near the anus) TENDERNESS IN MOUTH AND THROAT WITH OR WITHOUT PRESENCE OF ULCERS (sore throat, sores in mouth, or a toothache) UNUSUAL RASH, SWELLING OR PAIN  UNUSUAL VAGINAL DISCHARGE OR ITCHING   Items with * indicate a potential emergency and should be followed up as soon as possible or go to the Emergency Department if any problems should occur.  Please show the CHEMOTHERAPY ALERT CARD or IMMUNOTHERAPY ALERT CARD at check-in  to the Emergency Department and triage nurse.  Should you have questions after your visit or need to cancel or reschedule your appointment, please contact [place] [facility]  [phone] and follow the prompts.  Office hours are 8:00 a.m. to 4:30 p.m. Monday - Friday. Please note that voicemails left after 4:00 p.m. may not be returned until the following business day.  We are closed weekends and major holidays. You have access to a nurse at all times for urgent questions. Please call the main number to the clinic [phone] and follow the prompts.  For any non-urgent questions, you may also contact your provider using MyChart. We now offer e-Visits for anyone 90 and older to request care online for non-urgent symptoms. For details visit [email or web address].   Also download the MyChart app! Go to the app store, search "MyChart", open the app, select [place], and log in with your MyChart username and password.

## 2023-06-20 NOTE — Progress Notes (Signed)
Patient tolerated infusion well, no questions/concerns voiced. Patient stable at discharge. AVS given.

## 2023-07-10 ENCOUNTER — Ambulatory Visit: Admission: RE | Admit: 2023-07-10 | Payer: Medicaid Other | Source: Ambulatory Visit

## 2023-07-11 ENCOUNTER — Other Ambulatory Visit: Payer: Self-pay

## 2023-07-14 ENCOUNTER — Encounter: Payer: Self-pay | Admitting: Internal Medicine

## 2023-07-15 ENCOUNTER — Ambulatory Visit: Payer: Medicaid Other | Attending: Student in an Organized Health Care Education/Training Program

## 2023-07-15 DIAGNOSIS — R0602 Shortness of breath: Secondary | ICD-10-CM | POA: Insufficient documentation

## 2023-07-15 LAB — PULMONARY FUNCTION TEST ARMC ONLY
DL/VA % pred: 84 %
DL/VA: 3.58 ml/min/mmHg/L
DLCO unc % pred: 81 %
DLCO unc: 15.77 ml/min/mmHg
FEF 25-75 Post: 2.6 L/s
FEF 25-75 Pre: 2.51 L/s
FEF2575-%Change-Post: 3 %
FEF2575-%Pred-Post: 115 %
FEF2575-%Pred-Pre: 111 %
FEV1-%Change-Post: 3 %
FEV1-%Pred-Post: 104 %
FEV1-%Pred-Pre: 100 %
FEV1-Post: 2.54 L
FEV1-Pre: 2.45 L
FEV1FVC-%Change-Post: 1 %
FEV1FVC-%Pred-Pre: 106 %
FEV6-%Change-Post: 1 %
FEV6-%Pred-Post: 99 %
FEV6-%Pred-Pre: 97 %
FEV6-Post: 3.02 L
FEV6-Pre: 2.96 L
FEV6FVC-%Pred-Post: 103 %
FEV6FVC-%Pred-Pre: 103 %
FVC-%Change-Post: 1 %
FVC-%Pred-Post: 95 %
FVC-%Pred-Pre: 93 %
FVC-Post: 3.02 L
FVC-Pre: 2.96 L
Post FEV1/FVC ratio: 84 %
Post FEV6/FVC ratio: 100 %
Pre FEV1/FVC ratio: 83 %
Pre FEV6/FVC Ratio: 100 %
RV % pred: 76 %
RV: 1.49 L
TLC % pred: 87 %
TLC: 4.29 L

## 2023-07-15 MED ORDER — ALBUTEROL SULFATE (2.5 MG/3ML) 0.083% IN NEBU
2.5000 mg | INHALATION_SOLUTION | Freq: Once | RESPIRATORY_TRACT | Status: AC
  Administered 2023-07-15: 2.5 mg via RESPIRATORY_TRACT
  Filled 2023-07-15: qty 3

## 2023-07-16 MED FILL — Dexamethasone Sodium Phosphate Inj 100 MG/10ML: INTRAMUSCULAR | Qty: 1 | Status: AC

## 2023-07-17 ENCOUNTER — Inpatient Hospital Stay: Payer: Medicaid Other

## 2023-07-17 ENCOUNTER — Inpatient Hospital Stay: Payer: Medicaid Other | Attending: Internal Medicine

## 2023-07-17 ENCOUNTER — Inpatient Hospital Stay (HOSPITAL_BASED_OUTPATIENT_CLINIC_OR_DEPARTMENT_OTHER): Payer: Medicaid Other | Admitting: Internal Medicine

## 2023-07-17 ENCOUNTER — Other Ambulatory Visit: Payer: Self-pay

## 2023-07-17 VITALS — BP 103/72 | HR 75 | Temp 98.1°F | Wt 129.0 lb

## 2023-07-17 VITALS — BP 102/63 | HR 76

## 2023-07-17 DIAGNOSIS — Z79899 Other long term (current) drug therapy: Secondary | ICD-10-CM | POA: Diagnosis not present

## 2023-07-17 DIAGNOSIS — C8298 Follicular lymphoma, unspecified, lymph nodes of multiple sites: Secondary | ICD-10-CM | POA: Diagnosis not present

## 2023-07-17 DIAGNOSIS — C8200 Follicular lymphoma grade I, unspecified site: Secondary | ICD-10-CM | POA: Diagnosis present

## 2023-07-17 DIAGNOSIS — E079 Disorder of thyroid, unspecified: Secondary | ICD-10-CM | POA: Diagnosis not present

## 2023-07-17 DIAGNOSIS — Z5111 Encounter for antineoplastic chemotherapy: Secondary | ICD-10-CM | POA: Diagnosis present

## 2023-07-17 DIAGNOSIS — Z79624 Long term (current) use of inhibitors of nucleotide synthesis: Secondary | ICD-10-CM | POA: Diagnosis not present

## 2023-07-17 DIAGNOSIS — Z5112 Encounter for antineoplastic immunotherapy: Secondary | ICD-10-CM

## 2023-07-17 DIAGNOSIS — Z7952 Long term (current) use of systemic steroids: Secondary | ICD-10-CM | POA: Diagnosis not present

## 2023-07-17 LAB — CBC WITH DIFFERENTIAL (CANCER CENTER ONLY)
Abs Immature Granulocytes: 0.01 10*3/uL (ref 0.00–0.07)
Basophils Absolute: 0.1 10*3/uL (ref 0.0–0.1)
Basophils Relative: 2 %
Eosinophils Absolute: 0.1 10*3/uL (ref 0.0–0.5)
Eosinophils Relative: 3 %
HCT: 39.5 % (ref 36.0–46.0)
Hemoglobin: 13.3 g/dL (ref 12.0–15.0)
Immature Granulocytes: 0 %
Lymphocytes Relative: 8 %
Lymphs Abs: 0.4 10*3/uL — ABNORMAL LOW (ref 0.7–4.0)
MCH: 33.2 pg (ref 26.0–34.0)
MCHC: 33.7 g/dL (ref 30.0–36.0)
MCV: 98.5 fL (ref 80.0–100.0)
Monocytes Absolute: 0.7 10*3/uL (ref 0.1–1.0)
Monocytes Relative: 13 %
Neutro Abs: 3.7 10*3/uL (ref 1.7–7.7)
Neutrophils Relative %: 74 %
Platelet Count: 173 10*3/uL (ref 150–400)
RBC: 4.01 MIL/uL (ref 3.87–5.11)
RDW: 14.3 % (ref 11.5–15.5)
WBC Count: 5 10*3/uL (ref 4.0–10.5)
nRBC: 0 % (ref 0.0–0.2)

## 2023-07-17 LAB — CMP (CANCER CENTER ONLY)
ALT: 18 U/L (ref 0–44)
AST: 23 U/L (ref 15–41)
Albumin: 4 g/dL (ref 3.5–5.0)
Alkaline Phosphatase: 65 U/L (ref 38–126)
Anion gap: 7 (ref 5–15)
BUN: 10 mg/dL (ref 8–23)
CO2: 27 mmol/L (ref 22–32)
Calcium: 9.1 mg/dL (ref 8.9–10.3)
Chloride: 104 mmol/L (ref 98–111)
Creatinine: 0.69 mg/dL (ref 0.44–1.00)
GFR, Estimated: >60 mL/min (ref >60)
Glucose, Bld: 87 mg/dL (ref 70–99)
Potassium: 4.5 mmol/L (ref 3.5–5.1)
Sodium: 138 mmol/L (ref 135–145)
Total Bilirubin: 0.5 mg/dL (ref 0.3–1.2)
Total Protein: 7 g/dL (ref 6.5–8.1)

## 2023-07-17 MED ORDER — SODIUM CHLORIDE 0.9 % IV SOLN
10.0000 mg | Freq: Once | INTRAVENOUS | Status: AC
  Administered 2023-07-17: 10 mg via INTRAVENOUS
  Filled 2023-07-17: qty 10

## 2023-07-17 MED ORDER — SODIUM CHLORIDE 0.9 % IV SOLN
90.0000 mg/m2 | Freq: Once | INTRAVENOUS | Status: AC
  Administered 2023-07-17: 150 mg via INTRAVENOUS
  Filled 2023-07-17: qty 6

## 2023-07-17 MED ORDER — RITUXIMAB-HYALURONIDASE HUMAN 1400-23400 MG -UT/11.7ML ~~LOC~~ SOLN
1400.0000 mg | Freq: Once | SUBCUTANEOUS | Status: AC
  Administered 2023-07-17: 1400 mg via SUBCUTANEOUS
  Filled 2023-07-17 (×2): qty 11.7

## 2023-07-17 MED ORDER — ACETAMINOPHEN 325 MG PO TABS
650.0000 mg | ORAL_TABLET | Freq: Once | ORAL | Status: AC
  Administered 2023-07-17: 650 mg via ORAL
  Filled 2023-07-17: qty 2

## 2023-07-17 MED ORDER — SODIUM CHLORIDE 0.9 % IV SOLN
Freq: Once | INTRAVENOUS | Status: AC
  Filled 2023-07-17: qty 250

## 2023-07-17 MED ORDER — DIPHENHYDRAMINE HCL 25 MG PO CAPS
50.0000 mg | ORAL_CAPSULE | Freq: Once | ORAL | Status: AC
  Administered 2023-07-17: 50 mg via ORAL
  Filled 2023-07-17: qty 2

## 2023-07-17 MED ORDER — PALONOSETRON HCL INJECTION 0.25 MG/5ML
0.2500 mg | Freq: Once | INTRAVENOUS | Status: AC
  Administered 2023-07-17: 0.25 mg via INTRAVENOUS
  Filled 2023-07-17: qty 5

## 2023-07-17 MED FILL — Dexamethasone Sodium Phosphate Inj 100 MG/10ML: INTRAMUSCULAR | Qty: 1 | Status: AC

## 2023-07-17 NOTE — Patient Instructions (Signed)
[facility]  Discharge Instructions: Thank you for choosing [facility] to provide your oncology and hematology care.  If you have a lab appointment with the Cancer Center, please go directly to the Cancer Center and check in at the registration area.  Wear comfortable clothing and clothing appropriate for easy access to any Portacath or PICC line.   We strive to give you quality time with your provider. You may need to reschedule your appointment if you arrive late (15 or more minutes).  Arriving late affects you and other patients whose appointments are after yours.  Also, if you miss three or more appointments without notifying the office, you may be dismissed from the clinic at the provider's discretion.      For prescription refill requests, have your pharmacy contact our office and allow 72 hours for refills to be completed.    Today you received the following chemotherapy and/or immunotherapy agents- Rituximab, Bendamustine      To help prevent nausea and vomiting after your treatment, we encourage you to take your nausea medication as directed.  BELOW ARE SYMPTOMS THAT SHOULD BE REPORTED IMMEDIATELY: *FEVER GREATER THAN 100.4 F (38 C) OR HIGHER *CHILLS OR SWEATING *NAUSEA AND VOMITING THAT IS NOT CONTROLLED WITH YOUR NAUSEA MEDICATION *UNUSUAL SHORTNESS OF BREATH *UNUSUAL BRUISING OR BLEEDING *URINARY PROBLEMS (pain or burning when urinating, or frequent urination) *BOWEL PROBLEMS (unusual diarrhea, constipation, pain near the anus) TENDERNESS IN MOUTH AND THROAT WITH OR WITHOUT PRESENCE OF ULCERS (sore throat, sores in mouth, or a toothache) UNUSUAL RASH, SWELLING OR PAIN  UNUSUAL VAGINAL DISCHARGE OR ITCHING   Items with * indicate a potential emergency and should be followed up as soon as possible or go to the Emergency Department if any problems should occur.  Please show the CHEMOTHERAPY ALERT CARD or IMMUNOTHERAPY ALERT CARD  at check-in to the Emergency Department and triage nurse.  Should you have questions after your visit or need to cancel or reschedule your appointment, please contact [facility]  [phone] and follow the prompts.  Office hours are 8:00 a.m. to 4:30 p.m. Monday - Friday. Please note that voicemails left after 4:00 p.m. may not be returned until the following business day.  We are closed weekends and major holidays. You have access to a nurse at all times for urgent questions. Please call the main number to the clinic [phone] and follow the prompts.  For any non-urgent questions, you may also contact your provider using MyChart. We now offer e-Visits for anyone 75 and older to request care online for non-urgent symptoms. For details visit [email or web address].   Also download the MyChart app! Go to the app store, search "MyChart", open the app, select [facility], and log in with your MyChart username and password.

## 2023-07-17 NOTE — Progress Notes (Signed)
Patient states that after her chemo treatment, for about 2 weeks she has some nausea, fatigue, and decreased appetite.
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
## 2023-07-18 ENCOUNTER — Inpatient Hospital Stay: Payer: Medicaid Other

## 2023-07-18 VITALS — BP 97/63 | HR 75 | Temp 97.0°F | Resp 18

## 2023-07-18 DIAGNOSIS — C8298 Follicular lymphoma, unspecified, lymph nodes of multiple sites: Secondary | ICD-10-CM

## 2023-07-18 DIAGNOSIS — Z5112 Encounter for antineoplastic immunotherapy: Secondary | ICD-10-CM | POA: Diagnosis not present

## 2023-07-18 MED ORDER — SODIUM CHLORIDE 0.9 % IV SOLN
Freq: Once | INTRAVENOUS | Status: AC
  Filled 2023-07-18: qty 250

## 2023-07-18 MED ORDER — SODIUM CHLORIDE 0.9 % IV SOLN
90.0000 mg/m2 | Freq: Once | INTRAVENOUS | Status: AC
  Administered 2023-07-18: 150 mg via INTRAVENOUS
  Filled 2023-07-18: qty 6

## 2023-07-18 MED ORDER — SODIUM CHLORIDE 0.9 % IV SOLN
10.0000 mg | Freq: Once | INTRAVENOUS | Status: AC
  Administered 2023-07-18: 10 mg via INTRAVENOUS
  Filled 2023-07-18: qty 10

## 2023-07-18 NOTE — Patient Instructions (Signed)
[facility] [facility]  Discharge Instructions: Thank you for choosing [facility] to provide your oncology and hematology care.  If you have a lab appointment with the Cancer Center, please go directly to the Cancer Center and check in at the registration area.  Wear comfortable clothing and clothing appropriate for easy access to any Portacath or PICC line.   We strive to give you quality time with your provider. You may need to reschedule your appointment if you arrive late (15 or more minutes).  Arriving late affects you and other patients whose appointments are after yours.  Also, if you miss three or more appointments without notifying the office, you may be dismissed from the clinic at the provider's discretion.      For prescription refill requests, have your pharmacy contact our office and allow 72 hours for refills to be completed.    Today you received the following chemotherapy and/or immunotherapy agents Bendamustine      To help prevent nausea and vomiting after your treatment, we encourage you to take your nausea medication as directed.  BELOW ARE SYMPTOMS THAT SHOULD BE REPORTED IMMEDIATELY: *FEVER GREATER THAN 100.4 F (38 C) OR HIGHER *CHILLS OR SWEATING *NAUSEA AND VOMITING THAT IS NOT CONTROLLED WITH YOUR NAUSEA MEDICATION *UNUSUAL SHORTNESS OF BREATH *UNUSUAL BRUISING OR BLEEDING *URINARY PROBLEMS (pain or burning when urinating, or frequent urination) *BOWEL PROBLEMS (unusual diarrhea, constipation, pain near the anus) TENDERNESS IN MOUTH AND THROAT WITH OR WITHOUT PRESENCE OF ULCERS (sore throat, sores in mouth, or a toothache) UNUSUAL RASH, SWELLING OR PAIN  UNUSUAL VAGINAL DISCHARGE OR ITCHING   Items with * indicate a potential emergency and should be followed up as soon as possible or go to the Emergency Department if any problems should occur.  Please show the CHEMOTHERAPY ALERT CARD or IMMUNOTHERAPY ALERT CARD at check-in  to the Emergency Department and triage nurse.  Should you have questions after your visit or need to cancel or reschedule your appointment, please contact [place] [facility]  [phone] and follow the prompts.  Office hours are 8:00 a.m. to 4:30 p.m. Monday - Friday. Please note that voicemails left after 4:00 p.m. may not be returned until the following business day.  We are closed weekends and major holidays. You have access to a nurse at all times for urgent questions. Please call the main number to the clinic [phone] and follow the prompts.  For any non-urgent questions, you may also contact your provider using MyChart. We now offer e-Visits for anyone 51 and older to request care online for non-urgent symptoms. For details visit [email or web address].   Also download the MyChart app! Go to the app store, search "MyChart", open the app, select , and log in with your MyChart username and password.

## 2023-07-19 ENCOUNTER — Other Ambulatory Visit: Payer: Self-pay

## 2023-07-21 ENCOUNTER — Other Ambulatory Visit: Payer: Self-pay | Admitting: Internal Medicine

## 2023-07-21 ENCOUNTER — Ambulatory Visit: Payer: Medicaid Other | Admitting: Student in an Organized Health Care Education/Training Program

## 2023-08-04 ENCOUNTER — Ambulatory Visit: Admission: RE | Admit: 2023-08-04 | Payer: Medicaid Other | Source: Ambulatory Visit

## 2023-08-12 ENCOUNTER — Encounter: Payer: Self-pay | Admitting: Internal Medicine

## 2023-08-14 ENCOUNTER — Inpatient Hospital Stay: Payer: Medicaid Other

## 2023-08-14 ENCOUNTER — Inpatient Hospital Stay (HOSPITAL_BASED_OUTPATIENT_CLINIC_OR_DEPARTMENT_OTHER): Payer: Medicaid Other | Admitting: Internal Medicine

## 2023-08-14 ENCOUNTER — Inpatient Hospital Stay: Payer: Medicaid Other | Attending: Internal Medicine

## 2023-08-14 VITALS — BP 108/56 | HR 68 | Temp 97.0°F | Resp 17

## 2023-08-14 VITALS — BP 98/65 | HR 76 | Temp 96.4°F | Wt 133.0 lb

## 2023-08-14 DIAGNOSIS — C8298 Follicular lymphoma, unspecified, lymph nodes of multiple sites: Secondary | ICD-10-CM | POA: Diagnosis not present

## 2023-08-14 DIAGNOSIS — Z5112 Encounter for antineoplastic immunotherapy: Secondary | ICD-10-CM

## 2023-08-14 DIAGNOSIS — Z5111 Encounter for antineoplastic chemotherapy: Secondary | ICD-10-CM | POA: Diagnosis present

## 2023-08-14 DIAGNOSIS — D2271 Melanocytic nevi of right lower limb, including hip: Secondary | ICD-10-CM | POA: Insufficient documentation

## 2023-08-14 DIAGNOSIS — Z79899 Other long term (current) drug therapy: Secondary | ICD-10-CM | POA: Insufficient documentation

## 2023-08-14 DIAGNOSIS — C8200 Follicular lymphoma grade I, unspecified site: Secondary | ICD-10-CM | POA: Insufficient documentation

## 2023-08-14 LAB — CBC WITH DIFFERENTIAL (CANCER CENTER ONLY)
Abs Immature Granulocytes: 0.01 10*3/uL (ref 0.00–0.07)
Basophils Absolute: 0.1 10*3/uL (ref 0.0–0.1)
Basophils Relative: 2 %
Eosinophils Absolute: 0.2 10*3/uL (ref 0.0–0.5)
Eosinophils Relative: 2 %
HCT: 35.1 % — ABNORMAL LOW (ref 36.0–46.0)
Hemoglobin: 11.8 g/dL — ABNORMAL LOW (ref 12.0–15.0)
Immature Granulocytes: 0 %
Lymphocytes Relative: 6 %
Lymphs Abs: 0.4 10*3/uL — ABNORMAL LOW (ref 0.7–4.0)
MCH: 33.6 pg (ref 26.0–34.0)
MCHC: 33.6 g/dL (ref 30.0–36.0)
MCV: 100 fL (ref 80.0–100.0)
Monocytes Absolute: 0.9 10*3/uL (ref 0.1–1.0)
Monocytes Relative: 15 %
Neutro Abs: 4.6 10*3/uL (ref 1.7–7.7)
Neutrophils Relative %: 75 %
Platelet Count: 137 10*3/uL — ABNORMAL LOW (ref 150–400)
RBC: 3.51 MIL/uL — ABNORMAL LOW (ref 3.87–5.11)
RDW: 14.5 % (ref 11.5–15.5)
WBC Count: 6.2 10*3/uL (ref 4.0–10.5)
nRBC: 0 % (ref 0.0–0.2)

## 2023-08-14 LAB — CMP (CANCER CENTER ONLY)
ALT: 17 U/L (ref 0–44)
AST: 20 U/L (ref 15–41)
Albumin: 3.7 g/dL (ref 3.5–5.0)
Alkaline Phosphatase: 58 U/L (ref 38–126)
Anion gap: 6 (ref 5–15)
BUN: 13 mg/dL (ref 8–23)
CO2: 27 mmol/L (ref 22–32)
Calcium: 8.6 mg/dL — ABNORMAL LOW (ref 8.9–10.3)
Chloride: 105 mmol/L (ref 98–111)
Creatinine: 0.61 mg/dL (ref 0.44–1.00)
GFR, Estimated: >60 mL/min (ref >60)
Glucose, Bld: 84 mg/dL (ref 70–99)
Potassium: 3.9 mmol/L (ref 3.5–5.1)
Sodium: 138 mmol/L (ref 135–145)
Total Bilirubin: 0.5 mg/dL (ref <1.2)
Total Protein: 6.2 g/dL — ABNORMAL LOW (ref 6.5–8.1)

## 2023-08-14 MED ORDER — BENDAMUSTINE HCL CHEMO INJECTION 100 MG/4ML
90.0000 mg/m2 | Freq: Once | INTRAVENOUS | Status: AC
  Administered 2023-08-14: 150 mg via INTRAVENOUS
  Filled 2023-08-14: qty 6

## 2023-08-14 MED ORDER — SODIUM CHLORIDE 0.9 % IV SOLN
Freq: Once | INTRAVENOUS | Status: AC
  Filled 2023-08-14: qty 250

## 2023-08-14 MED ORDER — DEXAMETHASONE SODIUM PHOSPHATE 10 MG/ML IJ SOLN
10.0000 mg | Freq: Once | INTRAMUSCULAR | Status: AC
  Administered 2023-08-14: 10 mg via INTRAVENOUS
  Filled 2023-08-14: qty 1

## 2023-08-14 MED ORDER — RITUXIMAB-HYALURONIDASE HUMAN 1400-23400 MG -UT/11.7ML ~~LOC~~ SOLN
1400.0000 mg | Freq: Once | SUBCUTANEOUS | Status: AC
  Administered 2023-08-14: 1400 mg via SUBCUTANEOUS
  Filled 2023-08-14: qty 11.7

## 2023-08-14 MED ORDER — ACETAMINOPHEN 325 MG PO TABS
650.0000 mg | ORAL_TABLET | Freq: Once | ORAL | Status: AC
  Administered 2023-08-14: 650 mg via ORAL
  Filled 2023-08-14: qty 2

## 2023-08-14 MED ORDER — PALONOSETRON HCL INJECTION 0.25 MG/5ML
0.2500 mg | Freq: Once | INTRAVENOUS | Status: AC
  Administered 2023-08-14: 0.25 mg via INTRAVENOUS
  Filled 2023-08-14: qty 5

## 2023-08-14 MED ORDER — DIPHENHYDRAMINE HCL 25 MG PO CAPS
50.0000 mg | ORAL_CAPSULE | Freq: Once | ORAL | Status: AC
  Administered 2023-08-14: 50 mg via ORAL
  Filled 2023-08-14: qty 2

## 2023-08-14 NOTE — Progress Notes (Signed)
Patient feels like her moles are more raised now.
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
## 2023-08-15 ENCOUNTER — Inpatient Hospital Stay: Payer: Medicaid Other

## 2023-08-15 VITALS — BP 122/73 | HR 56 | Temp 97.5°F | Resp 18

## 2023-08-15 DIAGNOSIS — C8298 Follicular lymphoma, unspecified, lymph nodes of multiple sites: Secondary | ICD-10-CM

## 2023-08-15 DIAGNOSIS — Z5112 Encounter for antineoplastic immunotherapy: Secondary | ICD-10-CM | POA: Diagnosis not present

## 2023-08-15 MED ORDER — SODIUM CHLORIDE 0.9 % IV SOLN
90.0000 mg/m2 | Freq: Once | INTRAVENOUS | Status: AC
  Administered 2023-08-15: 150 mg via INTRAVENOUS
  Filled 2023-08-15: qty 6

## 2023-08-15 MED ORDER — SODIUM CHLORIDE 0.9 % IV SOLN
Freq: Once | INTRAVENOUS | Status: AC
  Filled 2023-08-15: qty 250

## 2023-08-15 MED ORDER — DEXAMETHASONE SODIUM PHOSPHATE 10 MG/ML IJ SOLN
10.0000 mg | Freq: Once | INTRAMUSCULAR | Status: AC
Start: 2023-08-15 — End: 2023-08-15
  Administered 2023-08-15: 10 mg via INTRAVENOUS
  Filled 2023-08-15: qty 1

## 2023-08-18 ENCOUNTER — Other Ambulatory Visit: Payer: Self-pay | Admitting: Internal Medicine

## 2023-09-02 ENCOUNTER — Encounter: Payer: Self-pay | Admitting: Internal Medicine

## 2023-09-11 ENCOUNTER — Inpatient Hospital Stay (HOSPITAL_BASED_OUTPATIENT_CLINIC_OR_DEPARTMENT_OTHER): Payer: Medicaid Other | Admitting: Internal Medicine

## 2023-09-11 ENCOUNTER — Inpatient Hospital Stay: Payer: Medicaid Other

## 2023-09-11 ENCOUNTER — Inpatient Hospital Stay: Payer: Medicaid Other | Attending: Internal Medicine

## 2023-09-11 VITALS — BP 118/63 | HR 64

## 2023-09-11 VITALS — BP 93/64 | HR 73 | Temp 97.2°F | Resp 17 | Wt 129.0 lb

## 2023-09-11 DIAGNOSIS — C8298 Follicular lymphoma, unspecified, lymph nodes of multiple sites: Secondary | ICD-10-CM

## 2023-09-11 DIAGNOSIS — Z5112 Encounter for antineoplastic immunotherapy: Secondary | ICD-10-CM | POA: Diagnosis present

## 2023-09-11 DIAGNOSIS — Z5111 Encounter for antineoplastic chemotherapy: Secondary | ICD-10-CM

## 2023-09-11 DIAGNOSIS — C8200 Follicular lymphoma grade I, unspecified site: Secondary | ICD-10-CM | POA: Insufficient documentation

## 2023-09-11 DIAGNOSIS — Z79899 Other long term (current) drug therapy: Secondary | ICD-10-CM | POA: Insufficient documentation

## 2023-09-11 LAB — CMP (CANCER CENTER ONLY)
ALT: 17 U/L (ref 0–44)
AST: 24 U/L (ref 15–41)
Albumin: 3.9 g/dL (ref 3.5–5.0)
Alkaline Phosphatase: 72 U/L (ref 38–126)
Anion gap: 7 (ref 5–15)
BUN: 12 mg/dL (ref 8–23)
CO2: 26 mmol/L (ref 22–32)
Calcium: 9.1 mg/dL (ref 8.9–10.3)
Chloride: 107 mmol/L (ref 98–111)
Creatinine: 0.73 mg/dL (ref 0.44–1.00)
GFR, Estimated: >60 mL/min (ref >60)
Glucose, Bld: 86 mg/dL (ref 70–99)
Potassium: 4.3 mmol/L (ref 3.5–5.1)
Sodium: 140 mmol/L (ref 135–145)
Total Bilirubin: 0.2 mg/dL (ref <1.2)
Total Protein: 6.8 g/dL (ref 6.5–8.1)

## 2023-09-11 LAB — CBC WITH DIFFERENTIAL (CANCER CENTER ONLY)
Abs Immature Granulocytes: 0.03 10*3/uL (ref 0.00–0.07)
Basophils Absolute: 0.1 10*3/uL (ref 0.0–0.1)
Basophils Relative: 2 %
Eosinophils Absolute: 0.1 10*3/uL (ref 0.0–0.5)
Eosinophils Relative: 2 %
HCT: 37.1 % (ref 36.0–46.0)
Hemoglobin: 12.8 g/dL (ref 12.0–15.0)
Immature Granulocytes: 1 %
Lymphocytes Relative: 7 %
Lymphs Abs: 0.4 10*3/uL — ABNORMAL LOW (ref 0.7–4.0)
MCH: 35.3 pg — ABNORMAL HIGH (ref 26.0–34.0)
MCHC: 34.5 g/dL (ref 30.0–36.0)
MCV: 102.2 fL — ABNORMAL HIGH (ref 80.0–100.0)
Monocytes Absolute: 0.8 10*3/uL (ref 0.1–1.0)
Monocytes Relative: 13 %
Neutro Abs: 4.4 10*3/uL (ref 1.7–7.7)
Neutrophils Relative %: 75 %
Platelet Count: 172 10*3/uL (ref 150–400)
RBC: 3.63 MIL/uL — ABNORMAL LOW (ref 3.87–5.11)
RDW: 13.7 % (ref 11.5–15.5)
WBC Count: 5.8 10*3/uL (ref 4.0–10.5)
nRBC: 0 % (ref 0.0–0.2)

## 2023-09-11 MED ORDER — RITUXIMAB-HYALURONIDASE HUMAN 1400-23400 MG -UT/11.7ML ~~LOC~~ SOLN
1400.0000 mg | Freq: Once | SUBCUTANEOUS | Status: AC
  Administered 2023-09-11: 1400 mg via SUBCUTANEOUS
  Filled 2023-09-11: qty 11.7

## 2023-09-11 MED ORDER — PALONOSETRON HCL INJECTION 0.25 MG/5ML
0.2500 mg | Freq: Once | INTRAVENOUS | Status: AC
  Administered 2023-09-11: 0.25 mg via INTRAVENOUS
  Filled 2023-09-11: qty 5

## 2023-09-11 MED ORDER — ACETAMINOPHEN 325 MG PO TABS
650.0000 mg | ORAL_TABLET | Freq: Once | ORAL | Status: AC
  Administered 2023-09-11: 650 mg via ORAL
  Filled 2023-09-11: qty 2

## 2023-09-11 MED ORDER — DIPHENHYDRAMINE HCL 25 MG PO CAPS
50.0000 mg | ORAL_CAPSULE | Freq: Once | ORAL | Status: AC
  Administered 2023-09-11: 50 mg via ORAL
  Filled 2023-09-11: qty 2

## 2023-09-11 MED ORDER — DEXAMETHASONE SODIUM PHOSPHATE 10 MG/ML IJ SOLN
10.0000 mg | Freq: Once | INTRAMUSCULAR | Status: AC
  Administered 2023-09-11: 10 mg via INTRAVENOUS
  Filled 2023-09-11: qty 1

## 2023-09-11 MED ORDER — SODIUM CHLORIDE 0.9 % IV SOLN
Freq: Once | INTRAVENOUS | Status: AC
  Filled 2023-09-11: qty 250

## 2023-09-11 MED ORDER — SODIUM CHLORIDE 0.9 % IV SOLN
90.0000 mg/m2 | Freq: Once | INTRAVENOUS | Status: AC
  Administered 2023-09-11: 150 mg via INTRAVENOUS
  Filled 2023-09-11: qty 6

## 2023-09-11 NOTE — Patient Instructions (Signed)
[facility] - A DEPT OF [facility]  Discharge Instructions: Thank you for choosing [facility] to provide your oncology and hematology care.  If you have a lab appointment with the Cancer Center, please go directly to the Cancer Center and check in at the registration area.  Wear comfortable clothing and clothing appropriate for easy access to any Portacath or PICC line.   We strive to give you quality time with your provider. You may need to reschedule your appointment if you arrive late (15 or more minutes).  Arriving late affects you and other patients whose appointments are after yours.  Also, if you miss three or more appointments without notifying the office, you may be dismissed from the clinic at the provider's discretion.      For prescription refill requests, have your pharmacy contact our office and allow 72 hours for refills to be completed.    Today you received the following chemotherapy and/or immunotherapy agents - rituximab, bendeka      To help prevent nausea and vomiting after your treatment, we encourage you to take your nausea medication as directed.  BELOW ARE SYMPTOMS THAT SHOULD BE REPORTED IMMEDIATELY: *FEVER GREATER THAN 100.4 F (38 C) OR HIGHER *CHILLS OR SWEATING *NAUSEA AND VOMITING THAT IS NOT CONTROLLED WITH YOUR NAUSEA MEDICATION *UNUSUAL SHORTNESS OF BREATH *UNUSUAL BRUISING OR BLEEDING *URINARY PROBLEMS (pain or burning when urinating, or frequent urination) *BOWEL PROBLEMS (unusual diarrhea, constipation, pain near the anus) TENDERNESS IN MOUTH AND THROAT WITH OR WITHOUT PRESENCE OF ULCERS (sore throat, sores in mouth, or a toothache) UNUSUAL RASH, SWELLING OR PAIN  UNUSUAL VAGINAL DISCHARGE OR ITCHING   Items with * indicate a potential emergency and should be followed up as soon as possible or go to the Emergency Department if any problems should occur.  Please show the CHEMOTHERAPY ALERT CARD or  IMMUNOTHERAPY ALERT CARD at check-in to the Emergency Department and triage nurse.  Should you have questions after your visit or need to cancel or reschedule your appointment, please contact [facility] - A DEPT OF [facility]  [phone] and follow the prompts.  Office hours are 8:00 a.m. to 4:30 p.m. Monday - Friday. Please note that voicemails left after 4:00 p.m. may not be returned until the following business day.  We are closed weekends and major holidays. You have access to a nurse at all times for urgent questions. Please call the main number to the clinic [phone] and follow the prompts.  For any non-urgent questions, you may also contact your provider using MyChart. We now offer e-Visits for anyone 89 and older to request care online for non-urgent symptoms. For details visit [email or web address].   Also download the MyChart app! Go to the app store, search "MyChart", open the app, select [place], and log in with your MyChart username and password.
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
## 2023-09-12 ENCOUNTER — Inpatient Hospital Stay: Payer: Medicaid Other

## 2023-09-12 ENCOUNTER — Other Ambulatory Visit: Payer: Self-pay

## 2023-09-12 ENCOUNTER — Encounter: Payer: Self-pay | Admitting: Internal Medicine

## 2023-09-12 VITALS — BP 109/72 | HR 63 | Temp 97.4°F | Resp 16

## 2023-09-12 DIAGNOSIS — C8298 Follicular lymphoma, unspecified, lymph nodes of multiple sites: Secondary | ICD-10-CM

## 2023-09-12 DIAGNOSIS — Z5112 Encounter for antineoplastic immunotherapy: Secondary | ICD-10-CM | POA: Diagnosis not present

## 2023-09-12 MED ORDER — SODIUM CHLORIDE 0.9 % IV SOLN
90.0000 mg/m2 | Freq: Once | INTRAVENOUS | Status: AC
  Administered 2023-09-12: 150 mg via INTRAVENOUS
  Filled 2023-09-12: qty 6

## 2023-09-12 MED ORDER — SODIUM CHLORIDE 0.9 % IV SOLN
Freq: Once | INTRAVENOUS | Status: AC
  Filled 2023-09-12: qty 250

## 2023-09-12 MED ORDER — DEXAMETHASONE SODIUM PHOSPHATE 10 MG/ML IJ SOLN
10.0000 mg | Freq: Once | INTRAMUSCULAR | Status: AC
  Administered 2023-09-12: 10 mg via INTRAVENOUS
  Filled 2023-09-12: qty 1

## 2023-09-12 NOTE — Patient Instructions (Signed)
[facility] - A DEPT OF [facility]  Discharge Instructions: Thank you for choosing [facility] to provide your oncology and hematology care.  If you have a lab appointment with the Cancer Center, please go directly to the Cancer Center and check in at the registration area.  Wear comfortable clothing and clothing appropriate for easy access to any Portacath or PICC line.   We strive to give you quality time with your provider. You may need to reschedule your appointment if you arrive late (15 or more minutes).  Arriving late affects you and other patients whose appointments are after yours.  Also, if you miss three or more appointments without notifying the office, you may be dismissed from the clinic at the provider's discretion.      For prescription refill requests, have your pharmacy contact our office and allow 72 hours for refills to be completed.    Today you received the following chemotherapy and/or immunotherapy agents Bendamustine      To help prevent nausea and vomiting after your treatment, we encourage you to take your nausea medication as directed.  BELOW ARE SYMPTOMS THAT SHOULD BE REPORTED IMMEDIATELY: *FEVER GREATER THAN 100.4 F (38 C) OR HIGHER *CHILLS OR SWEATING *NAUSEA AND VOMITING THAT IS NOT CONTROLLED WITH YOUR NAUSEA MEDICATION *UNUSUAL SHORTNESS OF BREATH *UNUSUAL BRUISING OR BLEEDING *URINARY PROBLEMS (pain or burning when urinating, or frequent urination) *BOWEL PROBLEMS (unusual diarrhea, constipation, pain near the anus) TENDERNESS IN MOUTH AND THROAT WITH OR WITHOUT PRESENCE OF ULCERS (sore throat, sores in mouth, or a toothache) UNUSUAL RASH, SWELLING OR PAIN  UNUSUAL VAGINAL DISCHARGE OR ITCHING   Items with * indicate a potential emergency and should be followed up as soon as possible or go to the Emergency Department if any problems should occur.  Please show the CHEMOTHERAPY ALERT CARD or IMMUNOTHERAPY  ALERT CARD at check-in to the Emergency Department and triage nurse.  Should you have questions after your visit or need to cancel or reschedule your appointment, please contact [facility] - A DEPT OF [facility]  [phone] and follow the prompts.  Office hours are 8:00 a.m. to 4:30 p.m. Monday - Friday. Please note that voicemails left after 4:00 p.m. may not be returned until the following business day.  We are closed weekends and major holidays. You have access to a nurse at all times for urgent questions. Please call the main number to the clinic [phone] and follow the prompts.  For any non-urgent questions, you may also contact your provider using MyChart. We now offer e-Visits for anyone 64 and older to request care online for non-urgent symptoms. For details visit [email or web address].   Also download the MyChart app! Go to the app store, search "MyChart", open the app, select [facility], and log in with your MyChart username and password.

## 2023-09-29 ENCOUNTER — Ambulatory Visit
Admission: RE | Admit: 2023-09-29 | Discharge: 2023-09-29 | Disposition: A | Discharge status: Home / Self Care | Payer: Medicaid Other | Source: Ambulatory Visit | Attending: Internal Medicine | Admitting: Internal Medicine

## 2023-09-29 DIAGNOSIS — C8298 Follicular lymphoma, unspecified, lymph nodes of multiple sites: Secondary | ICD-10-CM | POA: Insufficient documentation

## 2023-09-29 DIAGNOSIS — I2510 Atherosclerotic heart disease of native coronary artery without angina pectoris: Secondary | ICD-10-CM | POA: Diagnosis not present

## 2023-09-29 DIAGNOSIS — N200 Calculus of kidney: Secondary | ICD-10-CM | POA: Diagnosis not present

## 2023-09-29 DIAGNOSIS — I700 Atherosclerosis of aorta: Secondary | ICD-10-CM | POA: Diagnosis not present

## 2023-09-29 LAB — GLUCOSE, CAPILLARY: Glucose-Capillary: 78 mg/dL (ref 70–99)

## 2023-09-29 MED ORDER — FLUDEOXYGLUCOSE F - 18 (FDG) INJECTION
6.7000 | Freq: Once | INTRAVENOUS | Status: AC | PRN
  Administered 2023-09-29: 7.03 via INTRAVENOUS

## 2023-10-03 ENCOUNTER — Telehealth: Payer: Self-pay | Admitting: *Deleted

## 2023-10-03 NOTE — Telephone Encounter (Signed)
Patient would like the results of her recent PET scan.

## 2023-10-03 NOTE — Telephone Encounter (Signed)
FYI

## 2023-10-06 ENCOUNTER — Telehealth: Payer: Self-pay | Admitting: *Deleted

## 2023-10-06 NOTE — Telephone Encounter (Signed)
Called to let pt know that we are national backed up for radiology t read the scans. Dr. Alena Bills will call the radiology and see if she can get read in next 1-2 days.  Pt. Would like a phone call about the results

## 2023-10-09 ENCOUNTER — Encounter: Payer: Self-pay | Admitting: Internal Medicine

## 2023-10-09 ENCOUNTER — Telehealth: Payer: Self-pay

## 2023-10-09 NOTE — Telephone Encounter (Signed)
 Called and spoke with radiology about read this patient's PET scan she had on 09/29/2023, and the lady on the phone told me that she is putting it in for them to be read and update so doctor can see results.

## 2023-10-09 NOTE — Telephone Encounter (Signed)
 error

## 2023-10-14 ENCOUNTER — Telehealth: Payer: Self-pay | Admitting: *Deleted

## 2023-10-14 NOTE — Telephone Encounter (Signed)
 Patient called asking for Dr A to call to go over her PET results. Next appointment 11/13/23  IMPRESSION: 1. Complete metabolic response to therapy of lymphoma. 2. Age advanced coronary artery atherosclerosis. Recommend assessment of coronary risk factors. 3. Incidental findings, including: Aortic Atherosclerosis (ICD10-I70.0). Left nephrolithiasis.     Electronically Signed   By: Rockey Kilts M.D.   On: 10/10/2023 15:37

## 2023-10-20 ENCOUNTER — Other Ambulatory Visit: Payer: Self-pay

## 2023-10-21 ENCOUNTER — Telehealth: Payer: Self-pay | Admitting: *Deleted

## 2023-10-21 ENCOUNTER — Telehealth: Payer: Self-pay

## 2023-10-21 NOTE — Telephone Encounter (Signed)
 Called and spoke with patient in regards to her RSV vaccine question. I told her exactly what Dr. Clista said, Vaccine effectiveness can be low for up to 6 months of completing rituximab .  She can take RSV vaccine but just understand that efficacy may be low. Then patient said that she had a fever and some upset stomach with diarrhea, so I mentioned how she might want to wait until she started feeling better before getting the vaccine, she agreed and was grateful for the call.

## 2023-10-21 NOTE — Telephone Encounter (Signed)
 Patient called asking if it is ok for her to get the RSV Vaccine or is it too soon after completing chemotherapy to get it. Please advise  Looks like her last infusion was 09/12/23

## 2023-11-01 ENCOUNTER — Other Ambulatory Visit: Payer: Self-pay

## 2023-11-01 ENCOUNTER — Emergency Department
Admission: EM | Admit: 2023-11-01 | Discharge: 2023-11-01 | Disposition: A | Discharge status: Home / Self Care | Payer: Medicaid Other | Attending: Emergency Medicine | Admitting: Emergency Medicine

## 2023-11-01 ENCOUNTER — Emergency Department: Payer: Medicaid Other

## 2023-11-01 DIAGNOSIS — L02211 Cutaneous abscess of abdominal wall: Secondary | ICD-10-CM | POA: Diagnosis not present

## 2023-11-01 DIAGNOSIS — Z8572 Personal history of non-Hodgkin lymphomas: Secondary | ICD-10-CM | POA: Diagnosis not present

## 2023-11-01 DIAGNOSIS — R1032 Left lower quadrant pain: Secondary | ICD-10-CM | POA: Diagnosis present

## 2023-11-01 DIAGNOSIS — R112 Nausea with vomiting, unspecified: Secondary | ICD-10-CM | POA: Diagnosis not present

## 2023-11-01 DIAGNOSIS — R197 Diarrhea, unspecified: Secondary | ICD-10-CM | POA: Insufficient documentation

## 2023-11-01 LAB — URINALYSIS, ROUTINE W REFLEX MICROSCOPIC
Bacteria, UA: NONE SEEN
Bilirubin Urine: NEGATIVE
Glucose, UA: NEGATIVE mg/dL
Ketones, ur: 5 mg/dL — AB
Leukocytes,Ua: NEGATIVE
Nitrite: NEGATIVE
Protein, ur: NEGATIVE mg/dL
Specific Gravity, Urine: 1.025 (ref 1.005–1.030)
pH: 6 (ref 5.0–8.0)

## 2023-11-01 LAB — COMPREHENSIVE METABOLIC PANEL WITH GFR
ALT: 14 U/L (ref 0–44)
AST: 21 U/L (ref 15–41)
Albumin: 3.8 g/dL (ref 3.5–5.0)
Alkaline Phosphatase: 75 U/L (ref 38–126)
Anion gap: 10 (ref 5–15)
BUN: 14 mg/dL (ref 8–23)
CO2: 25 mmol/L (ref 22–32)
Calcium: 9.3 mg/dL (ref 8.9–10.3)
Chloride: 104 mmol/L (ref 98–111)
Creatinine, Ser: 0.63 mg/dL (ref 0.44–1.00)
GFR, Estimated: >60 mL/min
Glucose, Bld: 107 mg/dL — ABNORMAL HIGH (ref 70–99)
Potassium: 3.5 mmol/L (ref 3.5–5.1)
Sodium: 139 mmol/L (ref 135–145)
Total Bilirubin: 0.9 mg/dL (ref 0.0–1.2)
Total Protein: 7.2 g/dL (ref 6.5–8.1)

## 2023-11-01 LAB — CBC
HCT: 35.8 % — ABNORMAL LOW (ref 36.0–46.0)
Hemoglobin: 12.6 g/dL (ref 12.0–15.0)
MCH: 35.5 pg — ABNORMAL HIGH (ref 26.0–34.0)
MCHC: 35.2 g/dL (ref 30.0–36.0)
MCV: 100.8 fL — ABNORMAL HIGH (ref 80.0–100.0)
Platelets: 208 10*3/uL (ref 150–400)
RBC: 3.55 MIL/uL — ABNORMAL LOW (ref 3.87–5.11)
RDW: 13.2 % (ref 11.5–15.5)
WBC: 7.9 10*3/uL (ref 4.0–10.5)
nRBC: 0 % (ref 0.0–0.2)

## 2023-11-01 LAB — LIPASE, BLOOD: Lipase: 36 U/L (ref 11–51)

## 2023-11-01 MED ORDER — SODIUM CHLORIDE 0.9 % IV BOLUS
1000.0000 mL | Freq: Once | INTRAVENOUS | Status: AC
  Administered 2023-11-01: 1000 mL via INTRAVENOUS

## 2023-11-01 MED ORDER — OXYCODONE HCL 5 MG PO TABS: 5.0000 mg | ORAL_TABLET | Freq: Four times a day (QID) | ORAL | 0 refills | Status: AC | PRN

## 2023-11-01 MED ORDER — IOHEXOL 300 MG/ML  SOLN
80.0000 mL | Freq: Once | INTRAMUSCULAR | Status: AC | PRN
  Administered 2023-11-01: 80 mL via INTRAVENOUS

## 2023-11-01 MED ORDER — ONDANSETRON HCL 4 MG/2ML IJ SOLN
4.0000 mg | Freq: Once | INTRAMUSCULAR | Status: AC
  Administered 2023-11-01: 4 mg via INTRAVENOUS
  Filled 2023-11-01: qty 2

## 2023-11-01 MED ORDER — MORPHINE SULFATE (PF) 4 MG/ML IV SOLN
4.0000 mg | Freq: Once | INTRAVENOUS | Status: AC
  Administered 2023-11-01: 4 mg via INTRAVENOUS
  Filled 2023-11-01: qty 1

## 2023-11-01 MED ORDER — HYDROCODONE-ACETAMINOPHEN 5-325 MG PO TABS
1.0000 | ORAL_TABLET | Freq: Once | ORAL | Status: AC
Start: 2023-11-01 — End: 2023-11-01
  Administered 2023-11-01: 1 via ORAL
  Filled 2023-11-01: qty 1

## 2023-11-01 MED ORDER — LIDOCAINE HCL (PF) 1 % IJ SOLN
5.0000 mL | Freq: Once | INTRAMUSCULAR | Status: AC
  Administered 2023-11-01: 5 mL
  Filled 2023-11-01: qty 5

## 2023-11-01 NOTE — Discharge Instructions (Addendum)
You were seen in the today for your abdominal pain.  Your CT showed that you have an abscess over your abdominal wall that we did drain.  Please continue to remove packing daily and repack the wound for the next 2 to 3 days.  After this, you can leave the wound open.  Continue to take your antibiotics as directed.  I sent a short course of additional pain medication that you can take as needed.  Do not drive or operate machinery when taking this.  Return to the ER if your symptoms are not improving within a few days or if they are worsening or if you have any other new or concerning symptoms.  I have also included as needed information for follow-up with general surgery.

## 2023-11-01 NOTE — ED Triage Notes (Signed)
Pt reports she has a big abscess pointing to LLQ. Pt reports she has seen her PCP and received antibiotics on Thursday. Pt reports she started to feel nausea, vomiting, diarrhea. Pt is cancer pt and receives Chemo twice a month. Last Chemo treatment in December, reports she has to follow up to discuss Chemo. Pt is awake, alert and oriented.

## 2023-11-01 NOTE — ED Provider Notes (Addendum)
[facility] Provider Note    Event Date/Time   First MD Initiated Contact with Patient 11/01/23 1535     (approximate)   History   Abdominal Pain   HPI  Vanessa Miller is a 63 year old female with history of follicular lymphoma presenting to the emergency department for evaluation of abdominal pain.  Patient reports swelling in her left lower quadrant starting a few days ago.  On Thursday she saw her primary care doctor who put her on doxycycline.  Since then, she has had some nausea, vomiting, diarrhea.  Most recent chemotherapy was in December.  No fevers or chills.     Physical Exam   Triage Vital Signs: ED Triage Vitals  Encounter Vitals Group     BP 11/01/23 1128 101/71     Systolic BP Percentile --      Diastolic BP Percentile --      Pulse Rate 11/01/23 1128 99     Resp 11/01/23 1128 16     Temp 11/01/23 1128 98 F (36.7 C)     Temp Source 11/01/23 1128 Oral     SpO2 11/01/23 1128 95 %     Weight 11/01/23 1130 120 lb (54.4 kg)     Height 11/01/23 1130 5\' 3"  (1.6 m)     Head Circumference --      Peak Flow --      Pain Score 11/01/23 1130 8     Pain Loc --      Pain Education --      Exclude from Growth Chart --     Most recent vital signs: Vitals:   11/01/23 1633 11/01/23 1700  BP: 116/70   Pulse: 70 67  Resp: 16   Temp: 98 F (36.7 C)   SpO2: 100% 100%     General: Awake, interactive  CV:  Regular rate, good peripheral perfusion.  Resp:  Unlabored respirations, lungs clear to auscultation Abd:  Nondistended, soft, mild tenderness to palpation over the right upper abdomen, negative Murphy sign, area of focal swelling over the left lower quadrant with overlying erythema, warmth, tenderness, no active drainage Neuro:  Symmetric facial movement, fluid speech   ED Results / Procedures / Treatments   Labs (all labs ordered are listed, but only abnormal results are displayed) Labs Reviewed  COMPREHENSIVE METABOLIC  PANEL - Abnormal; Notable for the following components:      Result Value   Glucose, Bld 107 (*)    All other components within normal limits  CBC - Abnormal; Notable for the following components:   RBC 3.55 (*)    HCT 35.8 (*)    MCV 100.8 (*)    MCH 35.5 (*)    All other components within normal limits  URINALYSIS, ROUTINE W REFLEX MICROSCOPIC - Abnormal; Notable for the following components:   Color, Urine YELLOW (*)    APPearance HAZY (*)    Hgb urine dipstick MODERATE (*)    Ketones, ur 5 (*)    All other components within normal limits  LIPASE, BLOOD     EKG EKG independently reviewed interpreted by myself (ER attending) demonstrates:    RADIOLOGY Imaging independently reviewed and interpreted by myself demonstrates:    PROCEDURES:  Critical Care performed: No  .Incision and Drainage  Date/Time: 11/01/2023 7:46 PM  Performed by: Trinna Post, MD Authorized by: Trinna Post, MD   Consent:    Consent obtained:  Verbal   Consent given by:  Patient   Risks,  benefits, and alternatives were discussed: yes   Location:    Type:  Abscess   Location:  Trunk   Trunk location:  Abdomen Pre-procedure details:    Skin preparation:  Chlorhexidine Sedation:    Sedation type:  None Anesthesia:    Anesthesia method:  Local infiltration   Local anesthetic:  Lidocaine 1% w/o epi Procedure type:    Complexity:  Complex Procedure details:    Incision types:  Single straight   Wound management:  Probed and deloculated   Drainage:  Purulent and bloody   Drainage amount:  Copious   Packing materials:  1/4 in iodoform gauze Post-procedure details:    Procedure completion:  Tolerated    MEDICATIONS ORDERED IN ED: Medications  morphine (PF) 4 MG/ML injection 4 mg (4 mg Intravenous Given 11/01/23 1711)  ondansetron (ZOFRAN) injection 4 mg (4 mg Intravenous Given 11/01/23 1710)  sodium chloride 0.9 % bolus 1,000 mL (1,000 mLs Intravenous New Bag/Given 11/01/23 1710)  iohexol  (OMNIPAQUE) 300 MG/ML solution 80 mL (80 mLs Intravenous Contrast Given 11/01/23 1736)  lidocaine (PF) (XYLOCAINE) 1 % injection 5 mL (5 mLs Infiltration Given 11/01/23 1919)  HYDROcodone-acetaminophen (NORCO/VICODIN) 5-325 MG per tablet 1 tablet (1 tablet Oral Given 11/01/23 1941)     IMPRESSION / MDM / ASSESSMENT AND PLAN / ED COURSE  I reviewed the triage vital signs and the nursing notes.  Differential diagnosis includes, but is not limited to, subcutaneous abscess, intra-abdominal abscess, biliary pathology, appendicitis, colitis, other acute intra-abdominal process  Patient's presentation is most consistent with acute presentation with potential threat to life or bodily function.  63 year old female presenting with abdominal pain.  Area of palpable swelling in the left lower quadrant, but also with vomiting, diarrhea, some right sided pain.  Labs in triage overall reassuring.  Urine overall not suggestive of infection.  CT abdomen pelvis obtained which demonstrated abdominal wall abscess correlating with area of swelling noted on patient's exam, no other acute findings.  Given location over the abdominal wall, case was reviewed with Dr. Maurine Minister with general surgery team.  As the abscess is barely palpable on exam, he did feel that bedside I&D was appropriate.  He did recommend packing and having the patient packed the wound for the next 2 to 3 days.  Patient reports she has had an abscess previously and is comfortable with packing a wound.  I&D was performed as above.  Patient is already on doxycycline from her primary care doctor.  She was instructed to continue taking this.  As her this was not previously drained and she did not have source control, do not feel that her current presentation reflects treatment failure.  She was instructed to return to the ER if her symptoms are not improving within 48 hours, if she is worsening, or she develops any other new or concerning symptoms.  She was given as  needed information for follow-up with general surgery.  Will DC with short course of pain medication.  Patient discharged in stable condition.    FINAL CLINICAL IMPRESSION(S) / ED DIAGNOSES   Final diagnoses:  Abdominal wall abscess     Rx / DC Orders   ED Discharge Orders          Ordered    oxyCODONE (ROXICODONE) 5 MG immediate release tablet  Every 6 hours PRN        11/01/23 1950             Note:  This document was prepared using Dragon  voice recognition software and may include unintentional dictation errors.   Trinna Post, MD 11/01/23 0454    Trinna Post, MD 11/01/23 [phone]

## 2023-11-04 ENCOUNTER — Other Ambulatory Visit: Payer: Self-pay | Admitting: Nurse Practitioner

## 2023-11-12 ENCOUNTER — Telehealth: Payer: Self-pay | Admitting: *Deleted

## 2023-11-12 ENCOUNTER — Other Ambulatory Visit: Payer: Self-pay

## 2023-11-12 DIAGNOSIS — C8298 Follicular lymphoma, unspecified, lymph nodes of multiple sites: Secondary | ICD-10-CM

## 2023-11-12 DIAGNOSIS — Z5111 Encounter for antineoplastic chemotherapy: Secondary | ICD-10-CM

## 2023-11-12 NOTE — Telephone Encounter (Signed)
 Patient called saying that she wanted another refill of the acyclovir  because she was told that she is going to take it 400 mg once a day and the pharmacy is saying that she does not get any more refills.  I sent a message to Dr. Brutus and she sent me a message back saying that the patient does not need to take it anymore.  I called the patient and let her know that she no longer needs to have the acyclovir .

## 2023-11-13 ENCOUNTER — Encounter: Payer: Self-pay | Admitting: Internal Medicine

## 2023-11-13 ENCOUNTER — Inpatient Hospital Stay (HOSPITAL_BASED_OUTPATIENT_CLINIC_OR_DEPARTMENT_OTHER): Payer: Medicaid Other | Admitting: Internal Medicine

## 2023-11-13 ENCOUNTER — Inpatient Hospital Stay: Payer: Medicaid Other | Attending: Internal Medicine

## 2023-11-13 VITALS — BP 103/71 | HR 78 | Temp 97.6°F | Resp 16 | Wt 124.0 lb

## 2023-11-13 DIAGNOSIS — C8298 Follicular lymphoma, unspecified, lymph nodes of multiple sites: Secondary | ICD-10-CM

## 2023-11-13 DIAGNOSIS — C8200 Follicular lymphoma grade I, unspecified site: Secondary | ICD-10-CM | POA: Insufficient documentation

## 2023-11-13 DIAGNOSIS — Z5111 Encounter for antineoplastic chemotherapy: Secondary | ICD-10-CM

## 2023-11-13 LAB — CBC WITH DIFFERENTIAL (CANCER CENTER ONLY)
Abs Immature Granulocytes: 0.04 10*3/uL (ref 0.00–0.07)
Basophils Absolute: 0.1 10*3/uL (ref 0.0–0.1)
Basophils Relative: 1 %
Eosinophils Absolute: 0.1 10*3/uL (ref 0.0–0.5)
Eosinophils Relative: 1 %
HCT: 37 % (ref 36.0–46.0)
Hemoglobin: 12.7 g/dL (ref 12.0–15.0)
Immature Granulocytes: 1 %
Lymphocytes Relative: 6 %
Lymphs Abs: 0.4 10*3/uL — ABNORMAL LOW (ref 0.7–4.0)
MCH: 35.4 pg — ABNORMAL HIGH (ref 26.0–34.0)
MCHC: 34.3 g/dL (ref 30.0–36.0)
MCV: 103.1 fL — ABNORMAL HIGH (ref 80.0–100.0)
Monocytes Absolute: 0.6 10*3/uL (ref 0.1–1.0)
Monocytes Relative: 8 %
Neutro Abs: 6 10*3/uL (ref 1.7–7.7)
Neutrophils Relative %: 83 %
Platelet Count: 237 10*3/uL (ref 150–400)
RBC: 3.59 MIL/uL — ABNORMAL LOW (ref 3.87–5.11)
RDW: 13.1 % (ref 11.5–15.5)
WBC Count: 7.2 10*3/uL (ref 4.0–10.5)
nRBC: 0 % (ref 0.0–0.2)

## 2023-11-13 LAB — CMP (CANCER CENTER ONLY)
ALT: 20 U/L (ref 0–44)
AST: 30 U/L (ref 15–41)
Albumin: 3.6 g/dL (ref 3.5–5.0)
Alkaline Phosphatase: 64 U/L (ref 38–126)
Anion gap: 9 (ref 5–15)
BUN: 15 mg/dL (ref 8–23)
CO2: 27 mmol/L (ref 22–32)
Calcium: 8.9 mg/dL (ref 8.9–10.3)
Chloride: 103 mmol/L (ref 98–111)
Creatinine: 0.66 mg/dL (ref 0.44–1.00)
GFR, Estimated: >60 mL/min (ref >60)
Glucose, Bld: 91 mg/dL (ref 70–99)
Potassium: 3.6 mmol/L (ref 3.5–5.1)
Sodium: 139 mmol/L (ref 135–145)
Total Bilirubin: 0.5 mg/dL (ref 0.0–1.2)
Total Protein: 6.5 g/dL (ref 6.5–8.1)

## 2023-11-13 LAB — LACTATE DEHYDROGENASE: LDH: 129 U/L (ref 98–192)
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
## 2023-11-13 NOTE — Progress Notes (Signed)
 Patient had to go to the Hospital on 11/01/2023, due to an infection on in her lower pelvic area, while there she had a CT scan. Patient isn't having an appetite recently. Night sweats bad, all worsening within the past month.

## 2023-11-14 ENCOUNTER — Other Ambulatory Visit: Payer: Self-pay

## 2023-12-01 ENCOUNTER — Other Ambulatory Visit: Payer: Self-pay

## 2023-12-02 DIAGNOSIS — C8298 Follicular lymphoma, unspecified, lymph nodes of multiple sites: Secondary | ICD-10-CM

## 2023-12-02 NOTE — Progress Notes (Signed)
 Survivorship Care Plan visit completed.  Treatment summary reviewed and given to patient.  ASCO answers booklet reviewed and given to patient.  CARE program and Cancer Transitions discussed with patient along with other resources cancer center offers to patients and caregivers.  Patient verbalized understanding.

## 2023-12-03 ENCOUNTER — Encounter: Payer: Self-pay | Admitting: Internal Medicine

## 2023-12-03 ENCOUNTER — Other Ambulatory Visit: Payer: Self-pay

## 2023-12-25 ENCOUNTER — Inpatient Hospital Stay: Payer: Medicaid Other | Attending: Internal Medicine

## 2023-12-25 ENCOUNTER — Encounter: Payer: Self-pay | Admitting: Internal Medicine

## 2023-12-25 ENCOUNTER — Inpatient Hospital Stay: Payer: Medicaid Other | Admitting: Internal Medicine

## 2023-12-25 VITALS — BP 92/61 | HR 80 | Temp 98.0°F | Resp 20 | Wt 123.0 lb

## 2023-12-25 DIAGNOSIS — C8204 Follicular lymphoma grade I, lymph nodes of axilla and upper limb: Secondary | ICD-10-CM | POA: Diagnosis present

## 2023-12-25 DIAGNOSIS — C8298 Follicular lymphoma, unspecified, lymph nodes of multiple sites: Secondary | ICD-10-CM

## 2023-12-25 DIAGNOSIS — Z79899 Other long term (current) drug therapy: Secondary | ICD-10-CM | POA: Diagnosis not present

## 2023-12-25 DIAGNOSIS — Z807 Family history of other malignant neoplasms of lymphoid, hematopoietic and related tissues: Secondary | ICD-10-CM | POA: Diagnosis not present

## 2023-12-25 DIAGNOSIS — R0602 Shortness of breath: Secondary | ICD-10-CM | POA: Diagnosis not present

## 2023-12-25 DIAGNOSIS — Z9181 History of falling: Secondary | ICD-10-CM | POA: Insufficient documentation

## 2023-12-25 LAB — COMPREHENSIVE METABOLIC PANEL
ALT: 15 U/L (ref 0–44)
AST: 20 U/L (ref 15–41)
Albumin: 4.1 g/dL (ref 3.5–5.0)
Alkaline Phosphatase: 73 U/L (ref 38–126)
Anion gap: 9 (ref 5–15)
BUN: 10 mg/dL (ref 8–23)
CO2: 26 mmol/L (ref 22–32)
Calcium: 9 mg/dL (ref 8.9–10.3)
Chloride: 103 mmol/L (ref 98–111)
Creatinine, Ser: 0.64 mg/dL (ref 0.44–1.00)
GFR, Estimated: >60 mL/min (ref >60)
Glucose, Bld: 88 mg/dL (ref 70–99)
Potassium: 4 mmol/L (ref 3.5–5.1)
Sodium: 138 mmol/L (ref 135–145)
Total Bilirubin: 0.8 mg/dL (ref 0.0–1.2)
Total Protein: 6.8 g/dL (ref 6.5–8.1)

## 2023-12-25 LAB — CBC WITH DIFFERENTIAL/PLATELET
Abs Immature Granulocytes: 0.03 10*3/uL (ref 0.00–0.07)
Basophils Absolute: 0.1 10*3/uL (ref 0.0–0.1)
Basophils Relative: 2 %
Eosinophils Absolute: 0.1 10*3/uL (ref 0.0–0.5)
Eosinophils Relative: 2 %
HCT: 38.4 % (ref 36.0–46.0)
Hemoglobin: 12.9 g/dL (ref 12.0–15.0)
Immature Granulocytes: 1 %
Lymphocytes Relative: 7 %
Lymphs Abs: 0.4 10*3/uL — ABNORMAL LOW (ref 0.7–4.0)
MCH: 35 pg — ABNORMAL HIGH (ref 26.0–34.0)
MCHC: 33.6 g/dL (ref 30.0–36.0)
MCV: 104.1 fL — ABNORMAL HIGH (ref 80.0–100.0)
Monocytes Absolute: 0.6 10*3/uL (ref 0.1–1.0)
Monocytes Relative: 10 %
Neutro Abs: 4.8 10*3/uL (ref 1.7–7.7)
Neutrophils Relative %: 78 %
Platelets: 204 10*3/uL (ref 150–400)
RBC: 3.69 MIL/uL — ABNORMAL LOW (ref 3.87–5.11)
RDW: 12.4 % (ref 11.5–15.5)
WBC: 6.1 10*3/uL (ref 4.0–10.5)
nRBC: 0 % (ref 0.0–0.2)

## 2023-12-25 LAB — LACTATE DEHYDROGENASE: LDH: 119 U/L (ref 98–192)

## 2023-12-25 NOTE — Progress Notes (Signed)
 Patient is still having night sweats which only happen a couple of times a weeks. She is still having some fatigue. Shortness of breath, using her albuterol.
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
## 2023-12-26 ENCOUNTER — Other Ambulatory Visit: Payer: Self-pay

## 2023-12-31 ENCOUNTER — Encounter: Payer: Self-pay | Admitting: Student in an Organized Health Care Education/Training Program

## 2023-12-31 ENCOUNTER — Ambulatory Visit: Admitting: Student in an Organized Health Care Education/Training Program

## 2023-12-31 VITALS — BP 92/68 | HR 78 | Resp 16 | Ht 63.0 in

## 2023-12-31 DIAGNOSIS — R0602 Shortness of breath: Secondary | ICD-10-CM

## 2023-12-31 LAB — POCT EXHALED NITRIC OXIDE: FeNO level (ppb): 11
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
## 2024-01-01 ENCOUNTER — Other Ambulatory Visit: Payer: Self-pay

## 2024-01-30 ENCOUNTER — Ambulatory Visit: Admission: RE | Admit: 2024-01-30 | Source: Ambulatory Visit

## 2024-03-18 ENCOUNTER — Ambulatory Visit
Admission: RE | Admit: 2024-03-18 | Discharge: 2024-03-18 | Disposition: A | Discharge status: Home / Self Care | Source: Ambulatory Visit | Attending: Internal Medicine | Admitting: Internal Medicine

## 2024-03-18 DIAGNOSIS — C8298 Follicular lymphoma, unspecified, lymph nodes of multiple sites: Secondary | ICD-10-CM | POA: Insufficient documentation

## 2024-03-18 MED ORDER — IOHEXOL 300 MG/ML  SOLN
75.0000 mL | Freq: Once | INTRAMUSCULAR | Status: AC | PRN
  Administered 2024-03-18: 75 mL via INTRAVENOUS

## 2024-03-26 ENCOUNTER — Other Ambulatory Visit: Payer: Self-pay | Admitting: *Deleted

## 2024-03-26 DIAGNOSIS — C8298 Follicular lymphoma, unspecified, lymph nodes of multiple sites: Secondary | ICD-10-CM

## 2024-03-29 ENCOUNTER — Encounter

## 2024-03-29 ENCOUNTER — Inpatient Hospital Stay: Attending: Oncology

## 2024-03-29 ENCOUNTER — Encounter: Payer: Self-pay | Admitting: Oncology

## 2024-03-29 ENCOUNTER — Inpatient Hospital Stay (HOSPITAL_BASED_OUTPATIENT_CLINIC_OR_DEPARTMENT_OTHER): Admitting: Oncology

## 2024-03-29 VITALS — BP 126/86 | HR 76 | Temp 96.6°F | Resp 18 | Wt 119.7 lb

## 2024-03-29 DIAGNOSIS — C8298 Follicular lymphoma, unspecified, lymph nodes of multiple sites: Secondary | ICD-10-CM | POA: Diagnosis not present

## 2024-03-29 DIAGNOSIS — R61 Generalized hyperhidrosis: Secondary | ICD-10-CM | POA: Diagnosis not present

## 2024-03-29 DIAGNOSIS — E876 Hypokalemia: Secondary | ICD-10-CM

## 2024-03-29 DIAGNOSIS — R195 Other fecal abnormalities: Secondary | ICD-10-CM | POA: Diagnosis not present

## 2024-03-29 DIAGNOSIS — Z8572 Personal history of non-Hodgkin lymphomas: Secondary | ICD-10-CM | POA: Insufficient documentation

## 2024-03-29 LAB — CMP (CANCER CENTER ONLY)
ALT: 23 U/L (ref 0–44)
AST: 21 U/L (ref 15–41)
Albumin: 3.8 g/dL (ref 3.5–5.0)
Alkaline Phosphatase: 74 U/L (ref 38–126)
Anion gap: 9 (ref 5–15)
BUN: 10 mg/dL (ref 8–23)
CO2: 26 mmol/L (ref 22–32)
Calcium: 8.6 mg/dL — ABNORMAL LOW (ref 8.9–10.3)
Chloride: 101 mmol/L (ref 98–111)
Creatinine: 0.51 mg/dL (ref 0.44–1.00)
GFR, Estimated: >60 mL/min (ref >60)
Glucose, Bld: 118 mg/dL — ABNORMAL HIGH (ref 70–99)
Potassium: 3 mmol/L — ABNORMAL LOW (ref 3.5–5.1)
Sodium: 136 mmol/L (ref 135–145)
Total Bilirubin: 0.7 mg/dL (ref 0.0–1.2)
Total Protein: 6.4 g/dL — ABNORMAL LOW (ref 6.5–8.1)

## 2024-03-29 LAB — CBC WITH DIFFERENTIAL (CANCER CENTER ONLY)
Abs Immature Granulocytes: 0.02 10*3/uL (ref 0.00–0.07)
Basophils Absolute: 0.1 10*3/uL (ref 0.0–0.1)
Basophils Relative: 1 %
Eosinophils Absolute: 0.1 10*3/uL (ref 0.0–0.5)
Eosinophils Relative: 1 %
HCT: 35.5 % — ABNORMAL LOW (ref 36.0–46.0)
Hemoglobin: 12.1 g/dL (ref 12.0–15.0)
Immature Granulocytes: 0 %
Lymphocytes Relative: 9 %
Lymphs Abs: 0.5 10*3/uL — ABNORMAL LOW (ref 0.7–4.0)
MCH: 33.4 pg (ref 26.0–34.0)
MCHC: 34.1 g/dL (ref 30.0–36.0)
MCV: 98.1 fL (ref 80.0–100.0)
Monocytes Absolute: 0.5 10*3/uL (ref 0.1–1.0)
Monocytes Relative: 9 %
Neutro Abs: 4.2 10*3/uL (ref 1.7–7.7)
Neutrophils Relative %: 80 %
Platelet Count: 194 10*3/uL (ref 150–400)
RBC: 3.62 MIL/uL — ABNORMAL LOW (ref 3.87–5.11)
RDW: 12.5 % (ref 11.5–15.5)
WBC Count: 5.4 10*3/uL (ref 4.0–10.5)
nRBC: 0 % (ref 0.0–0.2)

## 2024-03-29 LAB — LACTATE DEHYDROGENASE: LDH: 134 U/L (ref 98–192)

## 2024-03-29 MED ORDER — POTASSIUM CHLORIDE CRYS ER 20 MEQ PO TBCR: 20.0000 meq | EXTENDED_RELEASE_TABLET | Freq: Every day | ORAL | 0 refills | Status: DC
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
## 2024-03-30 ENCOUNTER — Other Ambulatory Visit: Payer: Self-pay

## 2024-03-30 ENCOUNTER — Encounter: Payer: Self-pay | Admitting: Internal Medicine

## 2024-03-30 DIAGNOSIS — E876 Hypokalemia: Secondary | ICD-10-CM | POA: Insufficient documentation

## 2024-03-30 DIAGNOSIS — R195 Other fecal abnormalities: Secondary | ICD-10-CM | POA: Insufficient documentation

## 2024-03-30 NOTE — Assessment & Plan Note (Signed)
 Complaining of some increasedGrade 1-2, Completed BR x 6 cycles on 09/12/2023.  -Posttreatment PET scan showed complete response.  Continue surveillance. Recommend CT chest abdomen pelvis with contrast every 6 months for 2 years followed by annual CT till year 5.

## 2024-03-30 NOTE — Assessment & Plan Note (Signed)
 Chronic issue for her.  Unknown etiology. Recommend patient to see gastroenterology for further evaluation.

## 2024-03-30 NOTE — Assessment & Plan Note (Signed)
 Overall improved compared to symptoms prior to chemotherapy. Occasional Observation

## 2024-03-30 NOTE — Assessment & Plan Note (Signed)
 Recommend potassium chloride 20 mEq daily for 7 days.  Prescription sent to pharmacy. Repeat BMP in 3 weeks.

## 2024-04-05 ENCOUNTER — Other Ambulatory Visit: Payer: Self-pay

## 2024-04-07 ENCOUNTER — Ambulatory Visit: Admitting: Student in an Organized Health Care Education/Training Program

## 2024-04-19 ENCOUNTER — Inpatient Hospital Stay: Attending: Oncology

## 2024-04-19 DIAGNOSIS — Z8572 Personal history of non-Hodgkin lymphomas: Secondary | ICD-10-CM | POA: Diagnosis present

## 2024-04-19 DIAGNOSIS — C8298 Follicular lymphoma, unspecified, lymph nodes of multiple sites: Secondary | ICD-10-CM

## 2024-04-19 LAB — BASIC METABOLIC PANEL - CANCER CENTER ONLY
Anion gap: 3 — ABNORMAL LOW (ref 5–15)
BUN: 9 mg/dL (ref 8–23)
CO2: 27 mmol/L (ref 22–32)
Calcium: 8.7 mg/dL — ABNORMAL LOW (ref 8.9–10.3)
Chloride: 106 mmol/L (ref 98–111)
Creatinine: 0.62 mg/dL (ref 0.44–1.00)
GFR, Estimated: >60 mL/min (ref >60)
Glucose, Bld: 92 mg/dL (ref 70–99)
Potassium: 3.7 mmol/L (ref 3.5–5.1)
Sodium: 136 mmol/L (ref 135–145)

## 2024-05-13 ENCOUNTER — Telehealth: Payer: Self-pay | Admitting: *Deleted

## 2024-05-13 NOTE — Telephone Encounter (Signed)
 Spoke to patient who states that she felt like she was going to pass out 2 different times and every night she has been experiencing night sweats. Advised that Dr. Babara recommends monitoring symptoms for 2-3 weeks and to let us  know if symptoms worsen or do  not get better and she will repeat scan, since most recent scan looked good. Also advised pt that if she becomes unconscious to go to ER for further eval. Pt verbalized understanding.

## 2024-05-13 NOTE — Telephone Encounter (Signed)
 The patient called and said last week she was sick but now this week she is having night sweats and she is tired and she is basically dropping 1 pound every day by her Scales.  She wants to know if this is normal or I need to be seen or what is going on

## 2024-05-24 ENCOUNTER — Inpatient Hospital Stay
Admission: EM | Admit: 2024-05-24 | Discharge: 2024-05-27 | DRG: 603 | Disposition: A | Discharge status: Home / Self Care | Attending: Internal Medicine | Admitting: Internal Medicine

## 2024-05-24 ENCOUNTER — Emergency Department

## 2024-05-24 ENCOUNTER — Other Ambulatory Visit: Payer: Self-pay

## 2024-05-24 DIAGNOSIS — L02214 Cutaneous abscess of groin: Principal | ICD-10-CM | POA: Diagnosis present

## 2024-05-24 DIAGNOSIS — Z5986 Financial insecurity: Secondary | ICD-10-CM

## 2024-05-24 DIAGNOSIS — L270 Generalized skin eruption due to drugs and medicaments taken internally: Secondary | ICD-10-CM | POA: Diagnosis present

## 2024-05-24 DIAGNOSIS — Z79890 Hormone replacement therapy (postmenopausal): Secondary | ICD-10-CM | POA: Diagnosis not present

## 2024-05-24 DIAGNOSIS — Z881 Allergy status to other antibiotic agents status: Secondary | ICD-10-CM | POA: Diagnosis not present

## 2024-05-24 DIAGNOSIS — Z96651 Presence of right artificial knee joint: Secondary | ICD-10-CM | POA: Diagnosis present

## 2024-05-24 DIAGNOSIS — Z9884 Bariatric surgery status: Secondary | ICD-10-CM | POA: Diagnosis not present

## 2024-05-24 DIAGNOSIS — I10 Essential (primary) hypertension: Secondary | ICD-10-CM | POA: Diagnosis present

## 2024-05-24 DIAGNOSIS — Z833 Family history of diabetes mellitus: Secondary | ICD-10-CM

## 2024-05-24 DIAGNOSIS — D649 Anemia, unspecified: Secondary | ICD-10-CM | POA: Diagnosis present

## 2024-05-24 DIAGNOSIS — L0291 Cutaneous abscess, unspecified: Secondary | ICD-10-CM | POA: Diagnosis present

## 2024-05-24 DIAGNOSIS — A4901 Methicillin susceptible Staphylococcus aureus infection, unspecified site: Secondary | ICD-10-CM | POA: Diagnosis present

## 2024-05-24 DIAGNOSIS — C8298 Follicular lymphoma, unspecified, lymph nodes of multiple sites: Secondary | ICD-10-CM

## 2024-05-24 DIAGNOSIS — E876 Hypokalemia: Secondary | ICD-10-CM | POA: Diagnosis present

## 2024-05-24 DIAGNOSIS — Z8614 Personal history of Methicillin resistant Staphylococcus aureus infection: Secondary | ICD-10-CM | POA: Diagnosis not present

## 2024-05-24 DIAGNOSIS — Z79891 Long term (current) use of opiate analgesic: Secondary | ICD-10-CM | POA: Diagnosis not present

## 2024-05-24 DIAGNOSIS — Z79899 Other long term (current) drug therapy: Secondary | ICD-10-CM | POA: Diagnosis not present

## 2024-05-24 DIAGNOSIS — E039 Hypothyroidism, unspecified: Secondary | ICD-10-CM | POA: Diagnosis present

## 2024-05-24 DIAGNOSIS — Z90710 Acquired absence of both cervix and uterus: Secondary | ICD-10-CM | POA: Diagnosis not present

## 2024-05-24 DIAGNOSIS — Z8249 Family history of ischemic heart disease and other diseases of the circulatory system: Secondary | ICD-10-CM

## 2024-05-24 DIAGNOSIS — L03314 Cellulitis of groin: Principal | ICD-10-CM | POA: Diagnosis present

## 2024-05-24 DIAGNOSIS — L509 Urticaria, unspecified: Secondary | ICD-10-CM | POA: Diagnosis present

## 2024-05-24 DIAGNOSIS — E440 Moderate protein-calorie malnutrition: Secondary | ICD-10-CM | POA: Diagnosis present

## 2024-05-24 DIAGNOSIS — Z87442 Personal history of urinary calculi: Secondary | ICD-10-CM | POA: Diagnosis not present

## 2024-05-24 DIAGNOSIS — C8290 Follicular lymphoma, unspecified, unspecified site: Secondary | ICD-10-CM | POA: Diagnosis present

## 2024-05-24 DIAGNOSIS — C829A Follicular lymphoma, unspecified, in remission: Secondary | ICD-10-CM | POA: Diagnosis present

## 2024-05-24 LAB — CBC WITH DIFFERENTIAL/PLATELET
Abs Immature Granulocytes: 0.05 K/uL (ref 0.00–0.07)
Basophils Absolute: 0.1 K/uL (ref 0.0–0.1)
Basophils Relative: 1 %
Eosinophils Absolute: 0.1 K/uL (ref 0.0–0.5)
Eosinophils Relative: 1 %
HCT: 39 % (ref 36.0–46.0)
Hemoglobin: 13.3 g/dL (ref 12.0–15.0)
Immature Granulocytes: 1 %
Lymphocytes Relative: 5 %
Lymphs Abs: 0.6 K/uL — ABNORMAL LOW (ref 0.7–4.0)
MCH: 33.1 pg (ref 26.0–34.0)
MCHC: 34.1 g/dL (ref 30.0–36.0)
MCV: 97 fL (ref 80.0–100.0)
Monocytes Absolute: 0.9 K/uL (ref 0.1–1.0)
Monocytes Relative: 8 %
Neutro Abs: 9.3 K/uL — ABNORMAL HIGH (ref 1.7–7.7)
Neutrophils Relative %: 84 %
Platelets: 263 K/uL (ref 150–400)
RBC: 4.02 MIL/uL (ref 3.87–5.11)
RDW: 12.6 % (ref 11.5–15.5)
WBC: 11 K/uL — ABNORMAL HIGH (ref 4.0–10.5)
nRBC: 0 % (ref 0.0–0.2)

## 2024-05-24 LAB — BASIC METABOLIC PANEL WITH GFR
Anion gap: 15 (ref 5–15)
BUN: 15 mg/dL (ref 8–23)
CO2: 26 mmol/L (ref 22–32)
Calcium: 9.2 mg/dL (ref 8.9–10.3)
Chloride: 100 mmol/L (ref 98–111)
Creatinine, Ser: 0.67 mg/dL (ref 0.44–1.00)
GFR, Estimated: >60 mL/min (ref >60)
Glucose, Bld: 103 mg/dL — ABNORMAL HIGH (ref 70–99)
Potassium: 3 mmol/L — ABNORMAL LOW (ref 3.5–5.1)
Sodium: 141 mmol/L (ref 135–145)

## 2024-05-24 LAB — CBC
HCT: 39 % (ref 36.0–46.0)
Hemoglobin: 13.4 g/dL (ref 12.0–15.0)
MCH: 33.4 pg (ref 26.0–34.0)
MCHC: 34.4 g/dL (ref 30.0–36.0)
MCV: 97.3 fL (ref 80.0–100.0)
Platelets: 251 K/uL (ref 150–400)
RBC: 4.01 MIL/uL (ref 3.87–5.11)
RDW: 12.6 % (ref 11.5–15.5)
WBC: 11.1 K/uL — ABNORMAL HIGH (ref 4.0–10.5)
nRBC: 0 % (ref 0.0–0.2)

## 2024-05-24 LAB — LACTIC ACID, PLASMA
Lactic Acid, Venous: 1.3 mmol/L (ref 0.5–1.9)
Lactic Acid, Venous: 0.9 mmol/L (ref 0.5–1.9)

## 2024-05-24 MED ORDER — ONDANSETRON HCL 4 MG/2ML IJ SOLN
4.0000 mg | Freq: Four times a day (QID) | INTRAMUSCULAR | Status: DC | PRN
  Administered 2024-05-27: 4 mg via INTRAVENOUS
  Filled 2024-05-24: qty 2

## 2024-05-24 MED ORDER — POTASSIUM CHLORIDE 20 MEQ PO PACK
40.0000 meq | PACK | Freq: Two times a day (BID) | ORAL | Status: DC
  Administered 2024-05-24 (×2): 40 meq via ORAL
  Filled 2024-05-24 (×3): qty 2

## 2024-05-24 MED ORDER — ENOXAPARIN SODIUM 40 MG/0.4ML IJ SOSY
40.0000 mg | PREFILLED_SYRINGE | INTRAMUSCULAR | Status: DC
  Administered 2024-05-24 – 2024-05-26 (×3): 40 mg via SUBCUTANEOUS
  Filled 2024-05-24 (×3): qty 0.4

## 2024-05-24 MED ORDER — LINEZOLID 600 MG PO TABS
600.0000 mg | ORAL_TABLET | Freq: Two times a day (BID) | ORAL | Status: DC
  Administered 2024-05-24 – 2024-05-27 (×6): 600 mg via ORAL
  Filled 2024-05-24 (×6): qty 1

## 2024-05-24 MED ORDER — VENLAFAXINE HCL ER 150 MG PO CP24: 150.0000 mg | ORAL_CAPSULE | Freq: Every day | ORAL | Status: DC

## 2024-05-24 MED ORDER — VANCOMYCIN HCL 1250 MG/250ML IV SOLN
1250.0000 mg | Freq: Once | INTRAVENOUS | Status: AC
  Administered 2024-05-24: 1250 mg via INTRAVENOUS
  Filled 2024-05-24: qty 250

## 2024-05-24 MED ORDER — ACETAMINOPHEN 650 MG RE SUPP: 650.0000 mg | Freq: Four times a day (QID) | RECTAL | Status: DC | PRN

## 2024-05-24 MED ORDER — IOHEXOL 300 MG/ML  SOLN
100.0000 mL | Freq: Once | INTRAMUSCULAR | Status: AC | PRN
  Administered 2024-05-24: 100 mL via INTRAVENOUS

## 2024-05-24 MED ORDER — BUPROPION HCL ER (XL) 300 MG PO TB24
300.0000 mg | ORAL_TABLET | Freq: Every morning | ORAL | Status: DC
  Administered 2024-05-25 – 2024-05-27 (×3): 300 mg via ORAL
  Filled 2024-05-24 (×2): qty 1
  Filled 2024-05-24: qty 2
  Filled 2024-05-24: qty 1

## 2024-05-24 MED ORDER — ACETAMINOPHEN 325 MG PO TABS
650.0000 mg | ORAL_TABLET | Freq: Four times a day (QID) | ORAL | Status: DC | PRN
  Administered 2024-05-26: 650 mg via ORAL
  Filled 2024-05-24: qty 2

## 2024-05-24 MED ORDER — SODIUM CHLORIDE 0.9 % IV SOLN
2.0000 g | INTRAVENOUS | Status: DC
  Administered 2024-05-24 – 2024-05-27 (×4): 2 g via INTRAVENOUS
  Filled 2024-05-24 (×5): qty 20

## 2024-05-24 MED ORDER — OXYCODONE HCL 5 MG PO TABS
5.0000 mg | ORAL_TABLET | ORAL | Status: DC | PRN
  Administered 2024-05-24 – 2024-05-27 (×12): 5 mg via ORAL
  Filled 2024-05-24 (×13): qty 1

## 2024-05-24 MED ORDER — SODIUM CHLORIDE 0.9 % IV BOLUS
1000.0000 mL | Freq: Once | INTRAVENOUS | Status: AC
  Administered 2024-05-24: 1000 mL via INTRAVENOUS

## 2024-05-24 MED ORDER — ONDANSETRON HCL 4 MG PO TABS: 4.0000 mg | ORAL_TABLET | Freq: Four times a day (QID) | ORAL | Status: DC | PRN

## 2024-05-24 MED ORDER — DIAZEPAM 5 MG PO TABS
10.0000 mg | ORAL_TABLET | Freq: Two times a day (BID) | ORAL | Status: DC | PRN
  Administered 2024-05-25 – 2024-05-26 (×2): 10 mg via ORAL
  Filled 2024-05-24 (×2): qty 2

## 2024-05-24 MED ORDER — DIPHENHYDRAMINE HCL 25 MG PO CAPS
25.0000 mg | ORAL_CAPSULE | Freq: Four times a day (QID) | ORAL | Status: DC | PRN
  Administered 2024-05-24 – 2024-05-26 (×6): 25 mg via ORAL
  Filled 2024-05-24 (×6): qty 1

## 2024-05-24 MED ORDER — DIPHENHYDRAMINE-ZINC ACETATE 2-0.1 % EX CREA
TOPICAL_CREAM | Freq: Two times a day (BID) | CUTANEOUS | Status: DC | PRN
  Filled 2024-05-24: qty 28

## 2024-05-24 MED ORDER — THYROID 60 MG PO TABS
90.0000 mg | ORAL_TABLET | Freq: Every day | ORAL | Status: DC
  Administered 2024-05-25 – 2024-05-27 (×3): 90 mg via ORAL
  Filled 2024-05-24 (×3): qty 1

## 2024-05-24 NOTE — Progress Notes (Signed)
 Received patient from ED.  She is A&O x 4.  VS stable. On RA. #20g PIV in RFA. Ambulates independently.  C/O 7/10 pain in left groin, oxy administered at 1457. C/O itching in bil thighs and lower back from rash.  Benadryl  given at 1457.  Requested topical PRN benadryl  from pharmacy.  Admission assessment completed.  Pt resting in bed comfortably with call bell in reach.

## 2024-05-24 NOTE — Assessment & Plan Note (Signed)
 Follows with Dr. Brutus.  Completed treatment last December, currently in remission on surveillance

## 2024-05-24 NOTE — ED Triage Notes (Signed)
 Pt to Ed via POV from home. Pt reports had a bump on her stomach and was seen by PCP and given shot of antibiotic. Pt reports then had abscess on left groin and was palced on clindamycin but broke out in rash. PCP told pt to stop antibiotic due to possible reaction.

## 2024-05-24 NOTE — Assessment & Plan Note (Signed)
 Patient has a history of prior MRSA abscess.  Her last 1 was in January and required I&D.  This wound developed 1 week ago, PCP tried to I&D this in the office, but was unsuccessful.  Currently there appears to be no fluctuance, and the fluid collection is well-defined on imaging, so I suspect would not be successful to try again  She failed outpatient clindamycin, 5 days, and presented here tachycardic and with leukocytosis - Start linezolid , continue Rocephin  -Follow blood cultures

## 2024-05-24 NOTE — Assessment & Plan Note (Signed)
 Continue home Cytomel

## 2024-05-24 NOTE — ED Notes (Signed)
 Dr Jonel chaperoned during assessment of pts abscess.

## 2024-05-24 NOTE — ED Notes (Signed)
 See triage note  Presents with possible abscess area to left groin  States she was seen last week  Placed on clindamycin  Then developed a rash  was told to stop the med   Area is red,slightly swollen and red

## 2024-05-24 NOTE — Assessment & Plan Note (Signed)
 Chronic. - Supplement potassium

## 2024-05-24 NOTE — Consult Note (Signed)
 ED Pharmacy Antibiotic Sign Off An antibiotic consult was received from an ED provider for Vancomycin  per pharmacy dosing for cellulitis. A chart review was completed to assess appropriateness.   The following one time order(s) were placed:  Vancomycin  1250mg  IV.  Further antibiotic and/or antibiotic pharmacy consults should be ordered by the admitting provider if indicated.   Thank you for allowing pharmacy to be a part of this patient's care.   Anabelle Bungert A Aisha Greenberger, [facility]  Clinical Pharmacist 05/24/24 11:24 AM

## 2024-05-24 NOTE — ED Provider Notes (Signed)
 [facility] Provider Note    Event Date/Time   First MD Initiated Contact with Patient 05/24/24 [phone]     (approximate)   History   Abscess   HPI  Vanessa Miller is a 63 y.o. female with a past medical history of follicular lymphoma status posttreatment who presents today for evaluation of left groin swelling and redness.  Patient reports that this began on Thursday and she saw her primary care provider who gave her a shot of antibiotics and tried to drain her abscess but nothing came out.  She reports that she had a fever of 102 F on Thursday and was started on clindamycin that day.  She developed a rash after starting the clindamycin and called her primary care provider yesterday who advised her to stop the clindamycin due to concern for possible allergic reaction.  Patient did not have any shortness of breath, tongue or lip swelling, nausea, vomiting, or diarrhea.  She reports that she shaves her pubic area every other day.  She stopped taking the clindamycin last night.  She feels that the area has been worsening.  Patient reports that this is not at all similar to her diagnosis of follicular lymphoma.  Patient Active Problem List   Diagnosis Date Noted   Inguinal abscess 05/24/2024   Hypokalemia 03/30/2024   Loose bowel movements 03/30/2024   Encounter for antineoplastic chemotherapy 04/24/2023   Encounter for monoclonal antibody treatment for malignancy 04/24/2023   Night sweats 01/28/2023   Follicular lymphoma (HCC) 07/04/2022   Hiatal hernia    H/O total knee replacement 05/17/2015   Arthralgia of multiple joints 05/02/2015   Atrophic vaginitis 04/12/2014   Hemifacial spasm 01/24/2014   Back ache 01/20/2014   Renal colic 01/17/2014   Endometriosis 11/26/2013   Urge incontinence 07/26/2013   Bladder pain 07/15/2013   Calculus of kidney 07/15/2013   Bladder infection, chronic 07/15/2013   Incomplete bladder emptying 07/15/2013   Post  menopausal syndrome 06/25/2013   Arthropathia 03/03/2013   Obstructive apnea 01/21/2013   Allergic rhinitis 12/31/2012   Reflux 12/31/2012   Adult hypothyroidism 12/31/2012   BP (high blood pressure) 12/31/2012   Idiopathic localized osteoarthropathy 06/23/2012   Carpal tunnel syndrome 06/16/2012   Headache, chronic migraine without aura, intractable 08/06/2011   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   Big thyroid  06/06/2011          Physical Exam   Triage Vital Signs: ED Triage Vitals  Encounter Vitals Group     BP 05/24/24 0812 107/76     Girls Systolic BP Percentile --      Girls Diastolic BP Percentile --      Boys Systolic BP Percentile --      Boys Diastolic BP Percentile --      Pulse Rate 05/24/24 0812 (!) 115     Resp 05/24/24 0812 18     Temp 05/24/24 0812 98.1 F (36.7 C)     Temp Source 05/24/24 0812 Oral     SpO2 05/24/24 0812 98 %     Weight 05/24/24 0839 119 lb 11.4 oz (54.3 kg)     Height 05/24/24 0839 5' 3 (1.6 m)     Head Circumference --      Peak Flow --      Pain Score 05/24/24 0812 8     Pain Loc --      Pain Education --      Exclude from Growth Chart --  Most recent vital signs: Vitals:   05/24/24 0812 05/24/24 1214  BP: 107/76 110/70  Pulse: (!) 115 100  Resp: 18 18  Temp: 98.1 F (36.7 C) 98 F (36.7 C)  SpO2: 98% 98%    Physical Exam Vitals and nursing note reviewed.  Constitutional:      General: Awake and alert. No acute distress.    Appearance: Normal appearance. The patient is normal weight.  HENT:     Head: Normocephalic and atraumatic.     Mouth: Mucous membranes are moist.  Eyes:     General: PERRL. Normal EOMs        Right eye: No discharge.        Left eye: No discharge.     Conjunctiva/sclera: Conjunctivae normal.  Cardiovascular:     Rate and Rhythm: Normal rate and regular rhythm.     Pulses: Normal pulses.     Heart sounds: Normal heart sounds Pulmonary:     Effort: Pulmonary effort is normal. No  respiratory distress.     Breath sounds: Normal breath sounds.  Abdominal:     Abdomen is soft. There is no abdominal tenderness. No rebound or guarding. No distention. Left groin with 6 x 5 cm area of induration and erythema with swelling superior to that as well.  No surrounding erythema to the induration.  No crepitus.  Scattered erythematous lesions noted to bilateral legs and arms. Musculoskeletal:        General: No swelling. Normal range of motion.     Cervical back: Normal range of motion and neck supple.  Skin:    General: Skin is warm and dry.     Capillary Refill: Capillary refill takes less than 2 seconds.  Neurological:     Mental Status: The patient is awake and alert.      ED Results / Procedures / Treatments   Labs (all labs ordered are listed, but only abnormal results are displayed) Labs Reviewed  CBC - Abnormal; Notable for the following components:      Result Value   WBC 11.1 (*)    All other components within normal limits  BASIC METABOLIC PANEL WITH GFR - Abnormal; Notable for the following components:   Potassium 3.0 (*)    Glucose, Bld 103 (*)    All other components within normal limits  CBC WITH DIFFERENTIAL/PLATELET - Abnormal; Notable for the following components:   WBC 11.0 (*)    Neutro Abs 9.3 (*)    Lymphs Abs 0.6 (*)    All other components within normal limits  CULTURE, BLOOD (ROUTINE X 2)  CULTURE, BLOOD (ROUTINE X 2)  LACTIC ACID, PLASMA  LACTIC ACID, PLASMA     EKG     RADIOLOGY I independently reviewed and interpreted imaging and agree with radiologists findings.     PROCEDURES:  Critical Care performed:   Procedures   MEDICATIONS ORDERED IN ED: Medications  cefTRIAXone  (ROCEPHIN ) 2 g in sodium chloride  0.9 % 100 mL IVPB (0 g Intravenous Stopped 05/24/24 1158)  potassium chloride  (KLOR-CON ) packet 40 mEq (40 mEq Oral Given 05/24/24 1125)  buPROPion  (WELLBUTRIN  XL) 24 hr tablet 300 mg (has no administration in time  range)  venlafaxine  XR (EFFEXOR -XR) 24 hr capsule 150 mg (has no administration in time range)  diazepam  (VALIUM ) tablet 10 mg (has no administration in time range)  thyroid  (ARMOUR) tablet 90 mg (has no administration in time range)  linezolid  (ZYVOX ) tablet 600 mg (has no administration in time range)  enoxaparin  (  LOVENOX ) injection 40 mg (has no administration in time range)  acetaminophen  (TYLENOL ) tablet 650 mg (has no administration in time range)    Or  acetaminophen  (TYLENOL ) suppository 650 mg (has no administration in time range)  oxyCODONE  (Oxy IR/ROXICODONE ) immediate release tablet 5 mg (has no administration in time range)  ondansetron  (ZOFRAN ) tablet 4 mg (has no administration in time range)    Or  ondansetron  (ZOFRAN ) injection 4 mg (has no administration in time range)  diphenhydrAMINE -zinc  acetate (BENADRYL ) 2-0.1 % cream (has no administration in time range)  diphenhydrAMINE  (BENADRYL ) capsule 25 mg (has no administration in time range)  sodium chloride  0.9 % bolus 1,000 mL (0 mLs Intravenous Stopped 05/24/24 1334)  iohexol  (OMNIPAQUE ) 300 MG/ML solution 100 mL (100 mLs Intravenous Contrast Given 05/24/24 1009)  vancomycin  (VANCOREADY) IVPB 1250 mg/250 mL (0 mg Intravenous Stopped 05/24/24 1334)     IMPRESSION / MDM / ASSESSMENT AND PLAN / ED COURSE  I reviewed the triage vital signs and the nursing notes.   Differential diagnosis includes, but is not limited to, cellulitis, abscess, recurrence of her follicular lymphoma, sepsis.  I reviewed the patient's chart.  Patient is followed by Dr. Arminda for her follicular lymphoma for which she has been declared in remission.  Patient presents emergency department tachycardic to 115, though normotensive and afebrile.  Further workup is indicated.  Labs were obtained in triage.  However given her reported fever at home and her tachycardia here lactate and blood cultures were drawn as well.   CT scan obtained for further  evaluation.  CT reveals left inguinal subcutaneous edema and ill-defined fluid collection measuring 5.9 x 5.4 cm, suggestive of cellulitis versus phlegmon.  There does not appear to be anything drainable.  There is no fluctuance on exam to suggest drainage.  It was already attempted to be incised and drained by outpatient provider x 2 without any fluid expressed.  There is no extension into her perineal area to suggest Fournier's, there is no crepitus or hemodynamic instability on exam.  I recommended admission to the hospitalist service given failed outpatient therapy.  She was started on vancomycin  given her history of MRSA, and Rocephin .  I consulted pharmacy for vancomycin  dosing.  I consulted the hospitalist for admission.  Patient is in agreement with the plan.  Patient was accepted by Dr. Jonel.  Patient's presentation is most consistent with acute presentation with potential threat to life or bodily function.     FINAL CLINICAL IMPRESSION(S) / ED DIAGNOSES   Final diagnoses:  Cellulitis of groin, left  Phlegmon     Rx / DC Orders   ED Discharge Orders     None        Note:  This document was prepared using Dragon voice recognition software and may include unintentional dictation errors.   Marquasia Schmieder E, PA-C 05/24/24 1428    Arlander Charleston, MD 05/24/24 [phone]
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
## 2024-05-24 NOTE — Hospital Course (Addendum)
 63 y.o. F with follicular lymphoma, hypothyroidism, obesity s/p sleeve gastrectomy who presented with groin abscess failed outpatient managmeent.    Has a history of an abdominal wall abscesses in the past, diagnosed previously as MRSA. Last one was last Jan, treated as outpatient.    5 days ago, developed new lump in left groin.  Started on clindamycin, PCP tried to drain it last Friday but no fluid possible to drain.  Today, she had no improvement, so her PCP told her to go to the ER.  In the ER, WBC 11, K 3.0.  HR 115.  CT showed a 5cm fluid collection in the groin.  Admitted for IV antibiotics.

## 2024-05-25 ENCOUNTER — Inpatient Hospital Stay: Admitting: Anesthesiology

## 2024-05-25 ENCOUNTER — Encounter
Admission: EM | Disposition: A | Discharge status: Home / Self Care | Payer: Self-pay | Source: Home / Self Care | Attending: Internal Medicine

## 2024-05-25 ENCOUNTER — Encounter: Payer: Self-pay | Admitting: Family Medicine

## 2024-05-25 DIAGNOSIS — L03314 Cellulitis of groin: Principal | ICD-10-CM

## 2024-05-25 DIAGNOSIS — L02214 Cutaneous abscess of groin: Secondary | ICD-10-CM

## 2024-05-25 DIAGNOSIS — L0291 Cutaneous abscess, unspecified: Secondary | ICD-10-CM | POA: Diagnosis not present

## 2024-05-25 HISTORY — PX: INCISION AND DRAINAGE ABSCESS: SHX5864

## 2024-05-25 LAB — BASIC METABOLIC PANEL WITH GFR
Anion gap: 11 (ref 5–15)
BUN: 9 mg/dL (ref 8–23)
CO2: 27 mmol/L (ref 22–32)
Calcium: 8.7 mg/dL — ABNORMAL LOW (ref 8.9–10.3)
Chloride: 102 mmol/L (ref 98–111)
Creatinine, Ser: 0.49 mg/dL (ref 0.44–1.00)
GFR, Estimated: >60 mL/min (ref >60)
Glucose, Bld: 86 mg/dL (ref 70–99)
Potassium: 3 mmol/L — ABNORMAL LOW (ref 3.5–5.1)
Sodium: 140 mmol/L (ref 135–145)

## 2024-05-25 LAB — CBC
HCT: 31.3 % — ABNORMAL LOW (ref 36.0–46.0)
Hemoglobin: 11 g/dL — ABNORMAL LOW (ref 12.0–15.0)
MCH: 33.5 pg (ref 26.0–34.0)
MCHC: 35.1 g/dL (ref 30.0–36.0)
MCV: 95.4 fL (ref 80.0–100.0)
Platelets: 205 K/uL (ref 150–400)
RBC: 3.28 MIL/uL — ABNORMAL LOW (ref 3.87–5.11)
RDW: 12.4 % (ref 11.5–15.5)
WBC: 6.3 K/uL (ref 4.0–10.5)
nRBC: 0 % (ref 0.0–0.2)

## 2024-05-25 LAB — HIV ANTIBODY (ROUTINE TESTING W REFLEX): HIV Screen 4th Generation wRfx: NONREACTIVE

## 2024-05-25 SURGERY — INCISION AND DRAINAGE, ABSCESS
Anesthesia: General | Site: Groin | Laterality: Left

## 2024-05-25 MED ORDER — FENTANYL CITRATE (PF) 100 MCG/2ML IJ SOLN
INTRAMUSCULAR | Status: AC
  Filled 2024-05-25: qty 2

## 2024-05-25 MED ORDER — DEXAMETHASONE SODIUM PHOSPHATE 10 MG/ML IJ SOLN
INTRAMUSCULAR | Status: DC | PRN
  Administered 2024-05-25: 5 mg via INTRAVENOUS

## 2024-05-25 MED ORDER — OXYCODONE HCL 5 MG/5ML PO SOLN: 5.0000 mg | Freq: Once | ORAL | Status: AC | PRN

## 2024-05-25 MED ORDER — ONDANSETRON HCL 4 MG/2ML IJ SOLN
INTRAMUSCULAR | Status: DC | PRN
  Administered 2024-05-25: 4 mg via INTRAVENOUS

## 2024-05-25 MED ORDER — TRIAMCINOLONE ACETONIDE 0.5 % EX CREA
TOPICAL_CREAM | Freq: Three times a day (TID) | CUTANEOUS | Status: AC
  Filled 2024-05-25: qty 15

## 2024-05-25 MED ORDER — FENTANYL CITRATE (PF) 100 MCG/2ML IJ SOLN
25.0000 ug | INTRAMUSCULAR | Status: DC | PRN
  Administered 2024-05-25 (×2): 50 ug via INTRAVENOUS

## 2024-05-25 MED ORDER — POTASSIUM CHLORIDE CRYS ER 20 MEQ PO TBCR
40.0000 meq | EXTENDED_RELEASE_TABLET | Freq: Two times a day (BID) | ORAL | Status: AC
  Administered 2024-05-25 (×2): 40 meq via ORAL
  Filled 2024-05-25 (×2): qty 2

## 2024-05-25 MED ORDER — DEXAMETHASONE SODIUM PHOSPHATE 10 MG/ML IJ SOLN
INTRAMUSCULAR | Status: AC
Start: 2024-05-25 — End: 2024-05-25
  Filled 2024-05-25: qty 1

## 2024-05-25 MED ORDER — PROPOFOL 10 MG/ML IV BOLUS
INTRAVENOUS | Status: AC
  Filled 2024-05-25: qty 20

## 2024-05-25 MED ORDER — HYDROMORPHONE HCL 1 MG/ML IJ SOLN
INTRAMUSCULAR | Status: AC
  Filled 2024-05-25: qty 1

## 2024-05-25 MED ORDER — MIDAZOLAM HCL 2 MG/2ML IJ SOLN
INTRAMUSCULAR | Status: DC | PRN
  Administered 2024-05-25: 2 mg via INTRAVENOUS

## 2024-05-25 MED ORDER — HYDROMORPHONE HCL 1 MG/ML IJ SOLN
1.0000 mg | Freq: Once | INTRAMUSCULAR | Status: AC
  Administered 2024-05-25: 1 mg via INTRAVENOUS

## 2024-05-25 MED ORDER — PROPOFOL 10 MG/ML IV BOLUS
INTRAVENOUS | Status: DC | PRN
  Administered 2024-05-25: 100 mg via INTRAVENOUS
  Administered 2024-05-25: 20 mg via INTRAVENOUS

## 2024-05-25 MED ORDER — LIDOCAINE HCL (PF) 2 % IJ SOLN
INTRAMUSCULAR | Status: AC
  Filled 2024-05-25: qty 5

## 2024-05-25 MED ORDER — LIDOCAINE HCL (CARDIAC) PF 100 MG/5ML IV SOSY
PREFILLED_SYRINGE | INTRAVENOUS | Status: DC | PRN
  Administered 2024-05-25: 60 mg via INTRAVENOUS

## 2024-05-25 MED ORDER — ONDANSETRON HCL 4 MG/2ML IJ SOLN
INTRAMUSCULAR | Status: AC
  Filled 2024-05-25: qty 2

## 2024-05-25 MED ORDER — LACTATED RINGERS IV SOLN
INTRAVENOUS | Status: DC | PRN
Start: 2024-05-25 — End: 2024-05-25

## 2024-05-25 MED ORDER — EPHEDRINE SULFATE-NACL 50-0.9 MG/10ML-% IV SOSY
PREFILLED_SYRINGE | INTRAVENOUS | Status: DC | PRN
  Administered 2024-05-25: 5 mg via INTRAVENOUS

## 2024-05-25 MED ORDER — 0.9 % SODIUM CHLORIDE (POUR BTL) OPTIME
TOPICAL | Status: DC | PRN
  Administered 2024-05-25: 500 mL

## 2024-05-25 MED ORDER — MIDAZOLAM HCL 2 MG/2ML IJ SOLN
INTRAMUSCULAR | Status: AC
  Filled 2024-05-25: qty 2

## 2024-05-25 MED ORDER — OXYCODONE HCL 5 MG PO TABS
ORAL_TABLET | ORAL | Status: AC
Start: 2024-05-25 — End: 2024-05-25
  Filled 2024-05-25: qty 1

## 2024-05-25 MED ORDER — OXYCODONE HCL 5 MG PO TABS
5.0000 mg | ORAL_TABLET | Freq: Once | ORAL | Status: AC | PRN
  Administered 2024-05-25: 5 mg via ORAL

## 2024-05-25 MED ORDER — FENTANYL CITRATE (PF) 100 MCG/2ML IJ SOLN
INTRAMUSCULAR | Status: DC | PRN
  Administered 2024-05-25: 50 ug via INTRAVENOUS
  Administered 2024-05-25: 25 ug via INTRAVENOUS

## 2024-05-25 SURGICAL SUPPLY — 26 items
BNDG GAUZE DERMACEA FLUFF 4 (GAUZE/BANDAGES/DRESSINGS) IMPLANT
CHLORAPREP W/TINT 26 (MISCELLANEOUS) IMPLANT
DRAPE LAPAROTOMY 100X77 ABD (DRAPES) ×1 IMPLANT
ELECTRODE REM PT RTRN 9FT ADLT (ELECTROSURGICAL) ×1 IMPLANT
GAUZE 4X4 16PLY ~~LOC~~+RFID DBL (SPONGE) IMPLANT
GAUZE PAD ABD 8X10 STRL (GAUZE/BANDAGES/DRESSINGS) IMPLANT
GLOVE BIOGEL PI IND STRL 7.5 (GLOVE) ×1 IMPLANT
GLOVE SURG SYN 7.0 PF PI (GLOVE) ×1 IMPLANT
GOWN STRL REUS W/ TWL LRG LVL3 (GOWN DISPOSABLE) ×2 IMPLANT
MANIFOLD NEPTUNE II (INSTRUMENTS) ×1 IMPLANT
NDL HYPO 22X1.5 SAFETY MO (MISCELLANEOUS) IMPLANT
NDL SAFETY ECLIPSE 18X1.5 (NEEDLE) IMPLANT
NEEDLE HYPO 22X1.5 SAFETY MO (MISCELLANEOUS) ×1 IMPLANT
NS IRRIG 1000ML POUR BTL (IV SOLUTION) ×1 IMPLANT
NS IRRIG 500ML POUR BTL (IV SOLUTION) IMPLANT
PACK BASIN MINOR ARMC (MISCELLANEOUS) ×1 IMPLANT
SOLUTION PREP PVP 2OZ (MISCELLANEOUS) IMPLANT
SPONGE T-LAP 18X18 ~~LOC~~+RFID (SPONGE) ×1 IMPLANT
SUT VIC AB 3-0 SH 27X BRD (SUTURE) IMPLANT
SUTURE EHLN 3-0 FS-10 30 BLK (SUTURE) IMPLANT
SUTURE MNCRL 4-0 27XMF (SUTURE) IMPLANT
SWAB CULTURE AMIES ANAERIB BLU (MISCELLANEOUS) IMPLANT
SYR 10ML LL (SYRINGE) IMPLANT
TAPE CLOTH SURG 4X10 WHT LF (GAUZE/BANDAGES/DRESSINGS) IMPLANT
TRAP FLUID SMOKE EVACUATOR (MISCELLANEOUS) ×1 IMPLANT
WATER STERILE IRR 500ML POUR (IV SOLUTION) ×1 IMPLANT
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
## 2024-05-25 NOTE — Plan of Care (Signed)
 Almarie Ireland, LPN

## 2024-05-25 NOTE — Anesthesia Postprocedure Evaluation (Signed)
 Anesthesia Post Note  Patient: Vanessa Miller  Procedure(s) Performed: INCISION AND DRAINAGE, ABSCESS (Left: Groin)  Patient location during evaluation: PACU Anesthesia Type: General Level of consciousness: awake and alert Pain management: pain level controlled Vital Signs Assessment: post-procedure vital signs reviewed and stable Respiratory status: spontaneous breathing, nonlabored ventilation, respiratory function stable and patient connected to nasal cannula oxygen Cardiovascular status: blood pressure returned to baseline and stable Postop Assessment: no apparent nausea or vomiting Anesthetic complications: no   No notable events documented.   Last Vitals:  Vitals:   05/25/24 1052 05/25/24 1227  BP: 129/84 (!) 107/57  Pulse: 67   Resp: 17 16  Temp: 36.6 C 36.4 C  SpO2: 99% 100%    Last Pain:  Vitals:   05/25/24 1240  TempSrc:   PainSc: 10-Worst pain ever                 [facility]
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
## 2024-05-25 NOTE — Plan of Care (Signed)
  Problem: Education: Goal: Knowledge of General Education information will improve Description: Including pain rating scale, medication(s)/side effects and non-pharmacologic comfort measures Outcome: Progressing   Problem: Health Behavior/Discharge Planning: Goal: Ability to manage health-related needs will improve Outcome: Progressing   Problem: Clinical Measurements: Goal: Ability to maintain clinical measurements within normal limits will improve Outcome: Progressing Goal: Will remain free from infection Outcome: Progressing Goal: Diagnostic test results will improve Outcome: Progressing   Problem: Nutrition: Goal: Adequate nutrition will be maintained Outcome: Progressing   Problem: Pain Managment: Goal: General experience of comfort will improve and/or be controlled Outcome: Progressing   Problem: Clinical Measurements: Goal: Ability to avoid or minimize complications of infection will improve Outcome: Progressing   Problem: Skin Integrity: Goal: Skin integrity will improve Outcome: Progressing

## 2024-05-25 NOTE — Progress Notes (Signed)
 PROGRESS NOTE Apphia Cropley    DOB: 1960-12-13, 63 y.o.  FMW:[id number]    Code Status: Full Code   DOA: 05/24/2024   LOS: 1  Brief hospital course  Jizelle Conkey is a 63 y.o. female with a PMH significant for follicular lymphoma, hypothyroidism, obesity s/p sleeve gastrectomy who presented with L groin abscess failed PO abx and unsuccessful attempt at outpatient I&D.  CT shows roughly 6cm fluid collection suspicious for early abscess vs phlegmon. Tenderness and redness at site significant.    05/25/24 -consulted general surgery who will attempt to debride/ I&D in OR today. Patient remains on IV Abx.   Assessment & Plan  Principal Problem:   Inguinal abscess Active Problems:   Adult hypothyroidism   Follicular lymphoma (HCC)   Hypokalemia  L Inguinal abscess- Patient has a history of prior MRSA abscess.  She failed outpatient clindamycin, 5 days, and presented here tachycardic and with leukocytosis. S/p vancomycin  in ED and CTX.  - continue linezolid  and CTX -Follow blood cultures - general surgery planning to bring to OR today to address. Wound cultures would be appreciated if possible to obtain for further antibiotic guidance   Undefined dermatitis- possible drug reaction vs urticaria. Exam is unremarkable. Present on trunk and limbs- not on mucus membranes or soles/palms. Do not recommend PO steroids given ongoing infection but with being wide spread is difficult to treat topically - triamcinolone  topical ordered - may need follow up with her oncologist with h/o follicular lymphoma. Possible bx with dermatology if not resolving with washout of clindamycin     Hypokalemia- K+ 3.0  - Supplement potassium, monitor    Follicular lymphoma (HCC) Follows with Dr. Brutus.  Completed treatment last December, currently in remission on surveillance   Adult hypothyroidism - Continue home Cytomel  Body mass index is 21.21 kg/m.  VTE ppx: enoxaparin  (LOVENOX ) injection  40 mg Start: 05/24/24 2200  Diet:     Diet   Diet regular Room service appropriate? Yes; Fluid consistency: Thin   Consultants: General surgery   Subjective 05/25/24    Pt reports having significant pain and warmth of her groin lesion. Also pruritic rash wide spread not improved with benadryl     Objective  Blood pressure (!) 141/68, pulse 72, temperature 98.1 F (36.7 C), resp. rate 16, height 5' 3 (1.6 m), weight 54.3 kg, SpO2 100%.  Intake/Output Summary (Last 24 hours) at 05/25/2024 0715 Last data filed at 05/24/2024 1158 Gross per 24 hour  Intake 100 ml  Output --  Net 100 ml   Filed Weights   05/24/24 0839  Weight: 54.3 kg    Physical Exam:  General: awake, alert, NAD HEENT: atraumatic, clear conjunctiva, anicteric sclera, MMM, hearing grossly normal Respiratory: normal respiratory effort. Cardiovascular: quick capillary refill Gastrointestinal: soft, NT, ND Nervous: A&O x3. no gross focal neurologic deficits, normal speech Extremities: moves all equally, no edema, normal tone Skin: dry, intact. Indurated erythematous lesion on left groin approximately 12cm diameter with ill-defined border. Tender to palpation.  Papular eruptions diffusely on limbs with excoriations. None on mucus membranes or soles/palms.  Psychiatry: normal mood, congruent affect  Labs   I have personally reviewed the following labs and imaging studies CBC    Component Value Date/Time   WBC 6.3 05/25/2024 0443   RBC 3.28 (L) 05/25/2024 0443   HGB 11.0 (L) 05/25/2024 0443   HGB 12.1 03/29/2024 1021   HGB 10.1 (L) 10/13/2014 0541   HCT 31.3 (L) 05/25/2024 0443   HCT  39.5 10/06/2014 1009   PLT 205 05/25/2024 0443   PLT 194 03/29/2024 1021   PLT 236 10/12/2014 0413   MCV 95.4 05/25/2024 0443   MCV 94 10/06/2014 1009   MCH 33.5 05/25/2024 0443   MCHC 35.1 05/25/2024 0443   RDW 12.4 05/25/2024 0443   RDW 13.1 10/06/2014 1009   LYMPHSABS 0.6 (L) 05/24/2024 0843   MONOABS 0.9 05/24/2024  0843   EOSABS 0.1 05/24/2024 0843   BASOSABS 0.1 05/24/2024 0843      Latest Ref Rng & Units 05/25/2024    4:43 AM 05/24/2024    8:43 AM 04/19/2024   10:21 AM  BMP  Glucose 70 - 99 mg/dL 86  896  92   BUN 8 - 23 mg/dL 9  15  9    Creatinine 0.44 - 1.00 mg/dL 9.50  9.32  9.37   Sodium 135 - 145 mmol/L 140  141  136   Potassium 3.5 - 5.1 mmol/L 3.0  3.0  3.7   Chloride 98 - 111 mmol/L 102  100  106   CO2 22 - 32 mmol/L 27  26  27    Calcium  8.9 - 10.3 mg/dL 8.7  9.2  8.7     CT ABDOMEN PELVIS W CONTRAST Result Date: 05/24/2024 EXAM: CT ABDOMEN AND PELVIS WITH CONTRAST 05/24/2024 10:22:01 AM TECHNIQUE: CT of the abdomen and pelvis was performed with the administration of intravenous contrast. Multiplanar reformatted images are provided for review. Automated exposure control, iterative reconstruction, and/or weight based adjustment of the mA/kV was utilized to reduce the radiation dose to as low as reasonably achievable. COMPARISON: 03/18/2024 CLINICAL HISTORY: Lymphadenopathy, groin; left groin redness and swelling. Pt to Ed via POV from home. Pt reports had a bump on her stomach and was seen by PCP and given shot of antibiotic. Pt reports then had abscess on left groin and was palced on clindamycin but broke out in rash. PCP told pt to stop antibiotic due to possible reaction. FINDINGS: LOWER CHEST: No acute abnormality. LIVER: The liver is unremarkable. GALLBLADDER AND BILE DUCTS: Gallbladder is unremarkable. No biliary ductal dilatation. SPLEEN: No acute abnormality. PANCREAS: No acute abnormality. ADRENAL GLANDS: No acute abnormality. KIDNEYS, URETERS AND BLADDER: Left renal collecting system 7 mm nonobstructive calculus. No hydronephrosis. No perinephric or periureteral stranding. Urinary bladder is unremarkable. GI AND BOWEL: Status post sleeve gastrectomy. Stomach demonstrates no acute abnormality. There is no bowel obstruction. No bowel wall thickening. PERITONEUM AND RETROPERITONEUM: No  ascites. No free air. VASCULATURE: Abdominal aortic atherosclerosis. Aorta is normal in caliber. LYMPH NODES: No lymphadenopathy. REPRODUCTIVE ORGANS: No acute abnormality. BONES AND SOFT TISSUES: Intervertebral disc bulge at the lumbosacral junction. No acute osseous abnormality. Left inguinal subcutaneous edema and relatively ill-defined fluid up to 5.9 x 5.4 cm on coronal image 15. No gas within. No intrapelvic extension. IMPRESSION: 1. Left inguinal subcutaneous edema and relatively ill-defined fluid collection measuring up to 5.9 x 5.4 cm. Favor cellulitis and Phlegmon versus early abscess. 2. Left renal collecting system 7 mm nonobstructive calculus. 3. Aortic atherosclerosis (icd10-i70.0) Electronically signed by: Rockey Kilts MD 05/24/2024 10:43 AM EDT RP Workstation: [id number]    Disposition Plan & Communication  Patient status: Inpatient  Admitted From: Home Planned disposition location: Home Anticipated discharge date: 8/21 pending further IV Abx   Family Communication: none at bedside     Author: Marien LITTIE Piety, DO [facility] 05/25/2024, 7:15 AM   Available by Epic secure chat 7AM-7PM. If 7PM-7AM, please contact night-coverage.  [facility] contact  information found on [email or web address].

## 2024-05-25 NOTE — Op Note (Signed)
 Operative note  Preoperative diagnosis: Left inguinal abscess Postoperative diagnosis: Left inguinal crease abscess Surgeon: Jayson Endow, MD Procedure: Incision and drainage of left groin abscess EBL: 15 cc  After informed consent was obtained the patient was brought to the operating room placed supine on the operating room table.  Monitored anesthesia care was then induced and her left upper leg and groin were prepped and draped in the usual sterile fashion.  A surgical timeout was called identifying correct patient, site, side and procedure.  An 18-gauge needle was inserted in the most fluctuant part and there was return of pus consistent with an abscess.  This aspirate was then sent off for culture.  An incision was then made over that fluctuant part with expulsion of another 25 cc of purulent fluid.  Culture swabs of this abscess cavity were taken.  The abscess cavity was then broken up bluntly with finger dissection and a hemostat to get all the loculations broken up.  This connected to a collection that was superior to the inguinal ligament.  Hemostasis was then obtained.  The abscess cavity was then irrigated with warm saline solution.  It was then packed with Kerlix and covered with gauze and an ABD pad.  The patient was then awoken from anesthesia and transferred to the PACU in good condition.  Prior to termination of the procedure all sponge instrument counts were correct x 2.

## 2024-05-25 NOTE — Transfer of Care (Signed)
 Immediate Anesthesia Transfer of Care Note  Patient: Vanessa Miller  Procedure(s) Performed: INCISION AND DRAINAGE, ABSCESS (Left: Groin)  Patient Location: PACU  Anesthesia Type:General  Level of Consciousness: drowsy  Airway & Oxygen Therapy: Patient Spontanous Breathing and Patient connected to face mask oxygen  Post-op Assessment: Report given to RN and Post -op Vital signs reviewed and stable  Post vital signs: Reviewed and stable  Last Vitals:  Vitals Value Taken Time  BP 113/67 05/25/24 12:30  Temp 97.6   Pulse 58 05/25/24 12:31  Resp 15 05/25/24 12:31  SpO2 100 % 05/25/24 12:31  Vitals shown include unfiled device data.  Last Pain:  Vitals:   05/25/24 1052  TempSrc: Temporal  PainSc: 0-No pain         Complications: No notable events documented.

## 2024-05-26 ENCOUNTER — Encounter: Payer: Self-pay | Admitting: General Surgery

## 2024-05-26 DIAGNOSIS — L0291 Cutaneous abscess, unspecified: Secondary | ICD-10-CM | POA: Diagnosis not present

## 2024-05-26 LAB — BASIC METABOLIC PANEL WITH GFR
Anion gap: 11 (ref 5–15)
BUN: 11 mg/dL (ref 8–23)
CO2: 27 mmol/L (ref 22–32)
Calcium: 8.9 mg/dL (ref 8.9–10.3)
Chloride: 102 mmol/L (ref 98–111)
Creatinine, Ser: 0.65 mg/dL (ref 0.44–1.00)
GFR, Estimated: >60 mL/min (ref >60)
Glucose, Bld: 98 mg/dL (ref 70–99)
Potassium: 3 mmol/L — ABNORMAL LOW (ref 3.5–5.1)
Sodium: 140 mmol/L (ref 135–145)

## 2024-05-26 LAB — CBC
HCT: 31.7 % — ABNORMAL LOW (ref 36.0–46.0)
Hemoglobin: 10.9 g/dL — ABNORMAL LOW (ref 12.0–15.0)
MCH: 32.9 pg (ref 26.0–34.0)
MCHC: 34.4 g/dL (ref 30.0–36.0)
MCV: 95.8 fL (ref 80.0–100.0)
Platelets: 217 K/uL (ref 150–400)
RBC: 3.31 MIL/uL — ABNORMAL LOW (ref 3.87–5.11)
RDW: 12.4 % (ref 11.5–15.5)
WBC: 7.1 K/uL (ref 4.0–10.5)
nRBC: 0 % (ref 0.0–0.2)

## 2024-05-26 LAB — IRON: Iron: 33 ug/dL (ref 28–170)

## 2024-05-26 LAB — VITAMIN B12: Vitamin B-12: 537 pg/mL (ref 180–914)

## 2024-05-26 LAB — VITAMIN D 25 HYDROXY (VIT D DEFICIENCY, FRACTURES): Vit D, 25-Hydroxy: 37.72 ng/mL (ref 30–100)

## 2024-05-26 LAB — FOLATE: Folate: 8.8 ng/mL (ref >5.9)

## 2024-05-26 MED ORDER — ADULT MULTIVITAMIN W/MINERALS CH
1.0000 | ORAL_TABLET | Freq: Two times a day (BID) | ORAL | Status: DC
  Administered 2024-05-26 – 2024-05-27 (×3): 1 via ORAL
  Filled 2024-05-26 (×3): qty 1

## 2024-05-26 MED ORDER — CALCIUM CARBONATE ANTACID 500 MG PO CHEW
1.0000 | CHEWABLE_TABLET | Freq: Three times a day (TID) | ORAL | Status: DC
  Administered 2024-05-26 – 2024-05-27 (×4): 200 mg via ORAL
  Filled 2024-05-26 (×4): qty 1

## 2024-05-26 NOTE — Progress Notes (Signed)
  [facility]  Hospital Day(s): 2.   Post op day(s): 1 Day Post-Op.   Interval History:  Patient seen and examined No acute events or new complaints overnight.  Patient reports she is quite sore No fever, chills No new labs this morning Cx from OR with GPC She continues on Rocephin , Linezolid    Vital signs in last 24 hours: [min-max] current  Temp:  [97.5 F (36.4 C)-98.4 F (36.9 C)] 98.4 F (36.9 C) (08/20 0530) Pulse Rate:  [67-81] 79 (08/20 0530) Resp:  [13-18] 16 (08/20 0530) BP: (105-145)/(57-84) 115/71 (08/20 0530) SpO2:  [96 %-100 %] 100 % (08/20 0530) Weight:  [54.3 kg] 54.3 kg (08/19 1052)     Height: 5' 3 (160 cm) Weight: 54.3 kg BMI (Calculated): 21.21   Intake/Output last 2 shifts:  08/19 0701 - 08/20 0700 In: 780 [P.O.:480; I.V.:200; IV Piggyback:100] Out: -    Physical Exam:  Constitutional: alert, cooperative and no distress  Respiratory: breathing non-labored at rest  Cardiovascular: regular rate and sinus rhythm  Integumentary: Left inguinal wound with packing in place; removed, scant old blood in wound bed, this is expectedly tender, no evidence of residual abscess/necrosis. Packing replaced  Labs:     Latest Ref Rng & Units 05/25/2024    4:43 AM 05/24/2024    8:43 AM 03/29/2024   10:21 AM  CBC  WBC 4.0 - 10.5 K/uL 6.3  11.1    11.0  5.4   Hemoglobin 12.0 - 15.0 g/dL 88.9  86.5    86.6  87.8   Hematocrit 36.0 - 46.0 % 31.3  39.0    39.0  35.5   Platelets 150 - 400 K/uL 205  251    263  194       Latest Ref Rng & Units 05/25/2024    4:43 AM 05/24/2024    8:43 AM 04/19/2024   10:21 AM  CMP  Glucose 70 - 99 mg/dL 86  896  92   BUN 8 - 23 mg/dL 9  15  9    Creatinine 0.44 - 1.00 mg/dL 9.50  9.32  9.37   Sodium 135 - 145 mmol/L 140  141  136   Potassium 3.5 - 5.1 mmol/L 3.0  3.0  3.7   Chloride 98 - 111 mmol/L 102  100  106   CO2 22 - 32 mmol/L 27  26  27    Calcium  8.9 - 10.3 mg/dL 8.7  9.2  8.7      Imaging studies: No new pertinent imaging studies   Assessment/Plan: 63 y.o. female 1 Day Post-Op s/p incision and drainage of left inguinal abscess   - Continue local wound wound care; pack daily with Vase (okay to utilize normal saline as well) moistened Kerlix gauze, change as needed. Okay to remove to shower. She should continue this for 5 days post-op  - Continue Abx; agree with MRSA coverage given history of similar, follow up Cx - Pain control prn   - Mobilize as tolerated   - Further management per primary service  - Discharge Planning: Continue packing as described above. She will benefit from Abx for home; could treat for MRSA given history while Cx finalize. We will be happy to see her in~1 week for wound check   All of the above findings and recommendations were discussed with the patient, and the medical team, and all of patient's questions were answered to her expressed satisfaction.  -- Arthea Platt, PA-C  [facility] 05/26/2024,  7:22 AM M-F: 7am - 4pm

## 2024-05-26 NOTE — Discharge Instructions (Signed)
 Wound Care: Pack wound daily with saline moistened gauze, cover and secure. This should be changed daily. Okay to remove the packing to take a short shower. Do not submerge or soak wound. Replace dressing/packing afterwards. Continue this until 08/24

## 2024-05-26 NOTE — Progress Notes (Signed)
 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  GOAL:   Patient will meet greater than or equal to 90% of their needs  MONITOR:   PO intake, Supplement acceptance  REASON FOR ASSESSMENT:   Malnutrition Screening Tool    ASSESSMENT:   Pt with follicular lymphoma, hypothyroidism, obesity s/p sleeve gastrectomy who presented with groin abscess failed outpatient managmeent.  Pt admitted with lt groin abscess.   8/19- s/p Incision and drainage of left groin abscess   Reviewed I/O's: +780 ml x 24 hours and +880 ml since admission  Pt underwent robotic sleeve gastrectomy in 2019. Last RD visit on 01/08/2019.   Spoke with pt at bedside, who was pleasant and in good spirits today. Pt endorses some discomfort around site that she had surgery, but otherwise doing well. Pt consuming breakfast, consumed about 25% of her omelette.   Pt endorses history of bariatric surgery. She admits that she has not been taking her bariatric MVI or calcium  PTA. She last had her labs checked a few years ago, but unsure of any vitamin deficiencies. Pt reports that she often eats small, frequent meals.   Per pt, she underwent chemotherapy about a year ago for non-Hogkin's lymphoma, which caused her to lose weight. Pt reports her UBW is around 130@ and lost about 20# within the past year. Reviewed wt hx; pt has experienced a 10% wt loss over the past 11  months, which is not significant for time frame, however, is concerning given multiple cor-morbidities.   Per pt, she started consuming 1-2 Premier Protein shakes daily because I feel like I needed them.   Discussed importance of good meal and supplement intake to promote healing. RD reviewed formulary; pt is not accepting of formulary alternative to Premier Protein. RD discussed recommended vitamin regimen and encouraged compliance. RD will draw labs to assess for potential vitamin deficiencies.   Labs reviewed: K: 3.0. Calcium : 8.9.    NUTRITION - FOCUSED PHYSICAL EXAM:  Flowsheet Row Most Recent Value  Orbital Region Mild depletion  Upper Arm Region Moderate depletion  Thoracic and Lumbar Region Mild depletion  Buccal Region Mild depletion  Temple Region Mild depletion  Clavicle Bone Region Moderate depletion  Clavicle and Acromion Bone Region Moderate depletion  Scapular Bone Region Moderate depletion  Dorsal Hand Mild depletion  Patellar Region Mild depletion  Anterior Thigh Region Mild depletion  Posterior Calf Region Mild depletion  Edema (RD Assessment) None  Hair Reviewed  Eyes Reviewed  Mouth Reviewed  Skin Reviewed  Nails Reviewed    Diet Order:   Diet Order             Diet regular Room service appropriate? Yes; Fluid consistency: Thin  Diet effective now                   EDUCATION NEEDS:   Education needs have been addressed  Skin:  Skin Assessment: Skin Integrity Issues: Skin Integrity Issues:: Incisions Incisions: lt groin  Last BM:  05/25/24  Height:   Ht  Readings from Last 1 Encounters:  05/25/24 5' 3 (1.6 m)    Weight:   Wt Readings from Last 1 Encounters:  05/25/24 54.3 kg    Ideal Body Weight:  52.3 kg  BMI:  Body mass index is 21.21 kg/m.  Estimated Nutritional Needs:   Kcal:  1700-1900  Protein:  90-105 grams  Fluid:  1.7-1.9 L    Margery ORN, RD, LDN, CDCES Registered Dietitian III Certified Diabetes Care and  Education Specialist If unable to reach this RD, please use RD Inpatient group chat on secure chat between hours of 8am-4 pm daily

## 2024-05-26 NOTE — Progress Notes (Signed)
 PROGRESS NOTE    Vanessa Miller  FMW:[id number] DOB: 11/24/1960 DOA: 05/24/2024 PCP: Donnie Handing, PA    Assessment & Plan:   Principal Problem:   Inguinal abscess Active Problems:   Adult hypothyroidism   Follicular lymphoma (HCC)   Hypokalemia   Cellulitis of groin, left  Assessment and Plan:  L Inguinal abscess: wound cxs NGTD, but gram stain showing gram positive cocci & no prelim results as per micro lab. Continue on linezolid , IV rocephin . S/p I&D of left groin abscess on 05/25/24   Undefined dermatitis etiology unclear, possible drug reaction vs urticaria. Likely need to f/u outpatient w/ dermatology    Hypokalemia: potassium given   Follicular lymphoma: in remission. Surveillance outpatient as per onco    Hypothyroidism: continue on home dose of armour thyroid    Normocytic anemia: no need for a transfusion currently       DVT prophylaxis: lovenox   Code Status: full  Family Communication:  Disposition Plan: possibly d/c home tomorrow   Level of care: Med-Surg  Status is: Inpatient Remains inpatient appropriate because: severity of illness    Consultants:  Gen surg   Procedures:   Antimicrobials: rocephin , linezolid     Subjective: Pt c/o malaise   Objective: Vitals:   05/25/24 1936 05/26/24 0404 05/26/24 0530 05/26/24 0834  BP: 105/63 107/75 115/71 106/70  Pulse: 79 81 79 (!) 101  Resp: 18 16 16 18   Temp: 97.8 F (36.6 C) 98.3 F (36.8 C) 98.4 F (36.9 C) 98.3 F (36.8 C)  TempSrc:  Oral  Oral  SpO2: 96% 100% 100% 98%  Weight:      Height:        Intake/Output Summary (Last 24 hours) at 05/26/2024 1520 Last data filed at 05/26/2024 1300 Gross per 24 hour  Intake 300 ml  Output --  Net 300 ml   Filed Weights   05/24/24 0839 05/25/24 1052  Weight: 54.3 kg 54.3 kg    Examination:  General exam: Appears calm and comfortable  Respiratory system: Clear to auscultation. Respiratory effort normal. Cardiovascular system: S1  & S2 +. No rubs, gallops or clicks.  Gastrointestinal system: Abdomen is nondistended, soft and nontender. Normal bowel sounds heard. Central nervous system: Alert and oriented. Moves all extremities  Psychiatry: Judgement and insight appear normal. Mood & affect appropriate.     Data Reviewed: I have personally reviewed following labs and imaging studies  CBC: Recent Labs  Lab 05/24/24 0843 05/25/24 0443 05/26/24 1024  WBC 11.0*  11.1* 6.3 7.1  NEUTROABS 9.3*  --   --   HGB 13.3  13.4 11.0* 10.9*  HCT 39.0  39.0 31.3* 31.7*  MCV 97.0  97.3 95.4 95.8  PLT 263  251 205 217   Basic Metabolic Panel: Recent Labs  Lab 05/24/24 0843 05/25/24 0443 05/26/24 1024  NA 141 140 140  K 3.0* 3.0* 3.0*  CL 100 102 102  CO2 26 27 27   GLUCOSE 103* 86 98  BUN 15 9 11   CREATININE 0.67 0.49 0.65  CALCIUM  9.2 8.7* 8.9   GFR: Estimated Creatinine Clearance: 60.3 mL/min (by C-G formula based on SCr of 0.65 mg/dL). Liver Function Tests: No results for input(s): AST, ALT, ALKPHOS, BILITOT, PROT, ALBUMIN in the last 168 hours. No results for input(s): LIPASE, AMYLASE in the last 168 hours. No results for input(s): AMMONIA in the last 168 hours. Coagulation Profile: No results for input(s): INR, PROTIME in the last 168 hours. Cardiac Enzymes: No results for input(s): CKTOTAL, CKMB,  CKMBINDEX, TROPONINI in the last 168 hours. BNP (last 3 results) No results for input(s): PROBNP in the last 8760 hours. HbA1C: No results for input(s): HGBA1C in the last 72 hours. CBG: No results for input(s): GLUCAP in the last 168 hours. Lipid Profile: No results for input(s): CHOL, HDL, LDLCALC, TRIG, CHOLHDL, LDLDIRECT in the last 72 hours. Thyroid  Function Tests: No results for input(s): TSH, T4TOTAL, FREET4, T3FREE, THYROIDAB in the last 72 hours. Anemia Panel: No results for input(s): VITAMINB12, FOLATE, FERRITIN, TIBC, IRON,  RETICCTPCT in the last 72 hours. Sepsis Labs: Recent Labs  Lab 05/24/24 0847 05/24/24 1118  LATICACIDVEN 1.3 0.9    Recent Results (from the past 240 hours)  Culture, blood (routine x 2)     Status: None (Preliminary result)   Collection Time: 05/24/24  9:27 AM   Specimen: BLOOD  Result Value Ref Range Status   Specimen Description BLOOD RIGHT WRIST  Final   Special Requests   Final    BOTTLES DRAWN AEROBIC AND ANAEROBIC Blood Culture results may not be optimal due to an inadequate volume of blood received in culture bottles   Culture   Final    NO GROWTH 2 DAYS Performed at [facility], [address], [place], [place] [address]    Report Status PENDING  Incomplete  Culture, blood (routine x 2)     Status: None (Preliminary result)   Collection Time: 05/24/24 11:18 AM   Specimen: BLOOD  Result Value Ref Range Status   Specimen Description BLOOD BLOOD LEFT FOREARM  Final   Special Requests   Final    BOTTLES DRAWN AEROBIC ONLY Blood Culture results may not be optimal due to an inadequate volume of blood received in culture bottles   Culture   Final    NO GROWTH 2 DAYS Performed at [facility], [address] [place], [place] [address]    Report Status PENDING  Incomplete  Aerobic/Anaerobic Culture w Gram Stain (surgical/deep wound)     Status: None (Preliminary result)   Collection Time: 05/25/24 12:09 PM   Specimen: Wound; Body Fluid  Result Value Ref Range Status   Specimen Description   Final    WOUND LEFT GROIN ABSCESS Performed at [facility], [address] [place], [place] [address]    Special Requests UNCAPPED SYRINGE  Final   Gram Stain   Final    ABUNDANT WBC PRESENT, PREDOMINANTLY PMN FEW GRAM POSITIVE COCCI    Culture   Final    NO GROWTH < 24 HOURS Performed at [facility], [address] [place], [place] [address]    Report Status PENDING  Incomplete  Aerobic/Anaerobic Culture w Gram Stain (surgical/deep  wound)     Status: None (Preliminary result)   Collection Time: 05/25/24 12:10 PM   Specimen: Wound; Abscess  Result Value Ref Range Status   Specimen Description   Final    WOUND LEFT GROIN ABSCESS Performed at [facility], [address] [place], [place] [address]    Special Requests   Final    NONE Performed at [facility], [address] [place], [place] [address]    Gram Stain   Final    FEW WBC PRESENT, PREDOMINANTLY PMN NO ORGANISMS SEEN    Culture   Final    NO GROWTH < 24 HOURS Performed at [facility], [address], [place], [place] [address]    Report Status PENDING  Incomplete  Radiology Studies: No results found.      Scheduled Meds:  buPROPion   300 mg Oral q morning   calcium  carbonate  1 tablet Oral TID WC   enoxaparin  (LOVENOX ) injection  40 mg Subcutaneous Q24H   linezolid   600 mg Oral Q12H   multivitamin with minerals  1 tablet Oral BID   thyroid   90 mg Oral Q0600   triamcinolone  cream   Topical TID   Continuous Infusions:  cefTRIAXone  (ROCEPHIN )  IV 2 g (05/26/24 1158)     LOS: 2 days       Anthony CHRISTELLA Pouch, MD [facility] Pager [phone]  If 7PM-7AM, please contact night-coverage [email or web address] 05/26/2024, 3:20 PM

## 2024-05-26 NOTE — TOC Initial Note (Signed)
 Transition of Care [facility]) - Initial/Assessment Note    Patient Details  Name: Vanessa Miller MRN: [id number] Date of Birth: [birth year]  Transition of Care [facility]) CM/SW Contact:    Dalia GORMAN Fuse, RN Phone Number: 05/26/2024, 9:47 AM  Clinical Narrative:                 Patient is from home, no TOC needs at this time. Please outreach if needs are identified.         Patient Goals and CMS Choice            Expected Discharge Plan and Services                                              Prior Living Arrangements/Services                       Activities of Daily Living   ADL Screening (condition at time of admission) Independently performs ADLs?: Yes (appropriate for developmental age) Is the patient deaf or have difficulty hearing?: No Does the patient have difficulty seeing, even when wearing glasses/contacts?: No Does the patient have difficulty concentrating, remembering, or making decisions?: No  Permission Sought/Granted                  Emotional Assessment              Admission diagnosis:  Phlegmon [L02.91] Abscess [L02.91] Cellulitis of groin, left [L03.314] Patient Active Problem List   Diagnosis Date Noted   Cellulitis of groin, left 05/25/2024   Inguinal abscess 05/24/2024   Hypokalemia 03/30/2024   Loose bowel movements 03/30/2024   Encounter for antineoplastic chemotherapy 04/24/2023   Encounter for monoclonal antibody treatment for malignancy 04/24/2023   Night sweats 01/28/2023   Follicular lymphoma (HCC) 07/04/2022   Hiatal hernia    H/O total knee replacement 05/17/2015   Arthralgia of multiple joints 05/02/2015   Atrophic vaginitis 04/12/2014   Hemifacial spasm 01/24/2014   Back ache 01/20/2014   Renal colic 01/17/2014   Endometriosis 11/26/2013   Urge incontinence 07/26/2013   Bladder pain 07/15/2013   Calculus of kidney 07/15/2013   Bladder infection, chronic 07/15/2013   Incomplete bladder  emptying 07/15/2013   Post menopausal syndrome 06/25/2013   Arthropathia 03/03/2013   Obstructive apnea 01/21/2013   Allergic rhinitis 12/31/2012   Reflux 12/31/2012   Adult hypothyroidism 12/31/2012   BP (high blood pressure) 12/31/2012   Idiopathic localized osteoarthropathy 06/23/2012   Carpal tunnel syndrome 06/16/2012   Headache, chronic migraine without aura, intractable 08/06/2011   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 [place] [address] Phone: [phone] Fax: [phone]     Social Drivers of Health (SDOH) Social History: SDOH Screenings   Food Insecurity: No Food Insecurity (05/24/2024)  Housing: Low Risk  (05/24/2024)  Transportation Needs: No Transportation Needs (05/24/2024)  Utilities: Not At Risk (05/24/2024)  Financial Resource Strain: Medium Risk (11/06/2023)   Received from [facility]  Tobacco Use: Low Risk  (05/25/2024)  Health Literacy: Low  Risk  (01/13/2021)   [facility] from [facility]   SDOH Interventions:     Readmission Risk Interventions     No data to display

## 2024-05-27 DIAGNOSIS — E440 Moderate protein-calorie malnutrition: Secondary | ICD-10-CM | POA: Insufficient documentation

## 2024-05-27 DIAGNOSIS — L0291 Cutaneous abscess, unspecified: Secondary | ICD-10-CM | POA: Diagnosis not present

## 2024-05-27 LAB — CBC
HCT: 32.8 % — ABNORMAL LOW (ref 36.0–46.0)
Hemoglobin: 11.2 g/dL — ABNORMAL LOW (ref 12.0–15.0)
MCH: 33 pg (ref 26.0–34.0)
MCHC: 34.1 g/dL (ref 30.0–36.0)
MCV: 96.8 fL (ref 80.0–100.0)
Platelets: 214 K/uL (ref 150–400)
RBC: 3.39 MIL/uL — ABNORMAL LOW (ref 3.87–5.11)
RDW: 12.3 % (ref 11.5–15.5)
WBC: 6.8 K/uL (ref 4.0–10.5)
nRBC: 0 % (ref 0.0–0.2)

## 2024-05-27 LAB — BASIC METABOLIC PANEL WITH GFR
Anion gap: 7 (ref 5–15)
BUN: 7 mg/dL — ABNORMAL LOW (ref 8–23)
CO2: 31 mmol/L (ref 22–32)
Calcium: 8.9 mg/dL (ref 8.9–10.3)
Chloride: 104 mmol/L (ref 98–111)
Creatinine, Ser: 0.59 mg/dL (ref 0.44–1.00)
GFR, Estimated: >60 mL/min (ref >60)
Glucose, Bld: 98 mg/dL (ref 70–99)
Potassium: 3.5 mmol/L (ref 3.5–5.1)
Sodium: 142 mmol/L (ref 135–145)

## 2024-05-27 MED ORDER — LINEZOLID 600 MG PO TABS: 600.0000 mg | ORAL_TABLET | Freq: Two times a day (BID) | ORAL | 0 refills | Status: AC

## 2024-05-27 MED ORDER — HYDROCODONE-ACETAMINOPHEN 10-325 MG PO TABS: 1.0000 | ORAL_TABLET | Freq: Four times a day (QID) | ORAL | 0 refills | Status: AC | PRN

## 2024-05-27 NOTE — Discharge Summary (Signed)
 Physician Discharge Summary  Vanessa Miller FMW:[id number] DOB: 1961/05/08 DOA: 05/24/2024  PCP: Donnie Handing, PA  Admit date: 05/24/2024 Discharge date: 05/27/2024  Admitted From: home Disposition:  home   Recommendations for Outpatient Follow-up:  Follow up with PCP in 1-2 weeks F/u w/ gen surg, PA Terryl, in 1 week   Home Health: no  Equipment/Devices:  Discharge Condition: stable  CODE STATUS:full  Diet recommendation: Regular  Brief/Interim Summary: HPI was taken from Dr. Jonel: 63 y.o. F with follicular lymphoma, hypothyroidism, obesity s/p sleeve gastrectomy who presented with groin abscess failed outpatient managmeent.     Has a history of an abdominal wall abscesses in the past, diagnosed previously as MRSA. Last one was last Jan, treated as outpatient.     5 days ago, developed new lump in left groin.  Started on clindamycin, PCP tried to drain it last Friday but no fluid possible to drain.  Today, she had no improvement, so her PCP told her to go to the ER.   In the ER, WBC 11, K 3.0.  HR 115.  CT showed a 5cm fluid collection in the groin.  Admitted for IV antibiotics.  Discharge Diagnoses:  Principal Problem:   Inguinal abscess Active Problems:   Adult hypothyroidism   Follicular lymphoma (HCC)   Hypokalemia   Cellulitis of groin, left   Malnutrition of moderate degree  L Inguinal abscess: wound cxs growing staph aureus, sens pending at time of d/c. D/c home on po linezolid  x 7 days more to complete the course. S/p I&D of left groin abscess on 05/25/24   Undefined dermatitis etiology unclear, possible drug reaction vs urticaria. Likely need to f/u outpatient w/ dermatology    Hypokalemia: WNL today    Follicular lymphoma: in remission. Surveillance outpatient as per onco    Hypothyroidism: continue on home dose of armour thyroid    Normocytic anemia: H&H are trending up   Discharge Instructions  Discharge Instructions     Diet general    Complete by: As directed    Discharge instructions   Complete by: As directed    F/u w/ PCP in 1-2 weeks. F/u w/ gen surg, PA Terryl, in 1 week. Continue local wound wound care; pack daily with Vase (okay to utilize normal saline as well) moistened Kerlix gauze, change as needed. Okay to remove to shower. She should continue this for 5 days post-op   Increase activity slowly   Complete by: As directed    No wound care   Complete by: As directed       Allergies as of 05/27/2024       Reactions   Bactrim [sulfamethoxazole-trimethoprim] Hives, Rash   Macrobid [nitrofurantoin Monohyd Macro] Hives   Clindamycin/lincomycin Itching, Rash   Nitrofurantoin Rash        Medication List     STOP taking these medications    potassium chloride  SA 20 MEQ tablet Commonly known as: KLOR-CON  M   triamcinolone  cream 0.1 % Commonly known as: KENALOG        TAKE these medications    Armour Thyroid  90 MG tablet Generic drug: thyroid  Take 90 mg by mouth daily.   buPROPion  300 MG 24 hr tablet Commonly known as: WELLBUTRIN  XL Take 300 mg by mouth every morning. What changed: Another medication with the same name was removed. Continue taking this medication, and follow the directions you see here.   Claritin 10 MG Caps Generic drug: Loratadine Take 10 mg by mouth daily.   desvenlafaxine 100  MG 24 hr tablet Commonly known as: PRISTIQ Take 100 mg by mouth daily.   diazepam  10 MG tablet Commonly known as: VALIUM  Take 10 mg by mouth every 12 (twelve) hours as needed for anxiety.   estradiol  1 MG tablet Commonly known as: ESTRACE  TAKE 1 TABLET (1 MG TOTAL) BY MOUTH DAILY.   HYDROcodone -acetaminophen  10-325 MG tablet Commonly known as: NORCO Take 1 tablet by mouth every 6 (six) hours as needed for up to 5 days for moderate pain (pain score 4-6) or severe pain (pain score 7-10). What changed:  when to take this reasons to take this   linezolid  600 MG tablet Commonly known as:  ZYVOX  Take 1 tablet (600 mg total) by mouth every 12 (twelve) hours for 7 days.   multivitamin with minerals tablet Take 1 tablet by mouth daily.   omeprazole 40 MG capsule Commonly known as: PRILOSEC Take 40 mg by mouth daily.   ondansetron  8 MG tablet Commonly known as: ZOFRAN  Take 1 tablet (8 mg total) by mouth every 8 (eight) hours as needed for nausea or vomiting.   Ventolin  HFA 108 (90 Base) MCG/ACT inhaler Generic drug: albuterol  TAKE 2 PUFFS BY MOUTH EVERY 6 HOURS AS NEEDED FOR WHEEZE OR SHORTNESS OF BREATH        Follow-up Information     Donnie Handing, PA Follow up.   Specialty: Family Medicine Why: hospital follow up Contact information: [address] [place] [address] [phone]         Terryl Arthea SAUNDERS, PA-C. Schedule an appointment as soon as possible for a visit in 1 week(s).   Specialty: Physician Assistant Why: Follow up in 1 week for wound check Contact information: [address] [place] [address] [phone]                Allergies  Allergen Reactions   Bactrim [Sulfamethoxazole-Trimethoprim] Hives and Rash   Macrobid [Nitrofurantoin Monohyd Macro] Hives   Clindamycin/Lincomycin Itching and Rash   Nitrofurantoin Rash    Consultations: General surg    Procedures/Studies: CT ABDOMEN PELVIS W CONTRAST Result Date: 05/24/2024 EXAM: CT ABDOMEN AND PELVIS WITH CONTRAST 05/24/2024 10:22:01 AM TECHNIQUE: CT of the abdomen and pelvis was performed with the administration of intravenous contrast. Multiplanar reformatted images are provided for review. Automated exposure control, iterative reconstruction, and/or weight based adjustment of the mA/kV was utilized to reduce the radiation dose to as low as reasonably achievable. COMPARISON: 03/18/2024 CLINICAL HISTORY: Lymphadenopathy, groin; left groin redness and swelling. Pt to Ed via POV from home. Pt reports had a bump on her stomach and was seen by PCP and given  shot of antibiotic. Pt reports then had abscess on left groin and was palced on clindamycin but broke out in rash. PCP told pt to stop antibiotic due to possible reaction. FINDINGS: LOWER CHEST: No acute abnormality. LIVER: The liver is unremarkable. GALLBLADDER AND BILE DUCTS: Gallbladder is unremarkable. No biliary ductal dilatation. SPLEEN: No acute abnormality. PANCREAS: No acute abnormality. ADRENAL GLANDS: No acute abnormality. KIDNEYS, URETERS AND BLADDER: Left renal collecting system 7 mm nonobstructive calculus. No hydronephrosis. No perinephric or periureteral stranding. Urinary bladder is unremarkable. GI AND BOWEL: Status post sleeve gastrectomy. Stomach demonstrates no acute abnormality. There is no bowel obstruction. No bowel wall thickening. PERITONEUM AND RETROPERITONEUM: No ascites. No free air. VASCULATURE: Abdominal aortic atherosclerosis. Aorta is normal in caliber. LYMPH NODES: No lymphadenopathy. REPRODUCTIVE ORGANS: No acute abnormality. BONES AND SOFT TISSUES: Intervertebral disc bulge at the lumbosacral junction.  No acute osseous abnormality. Left inguinal subcutaneous edema and relatively ill-defined fluid up to 5.9 x 5.4 cm on coronal image 15. No gas within. No intrapelvic extension. IMPRESSION: 1. Left inguinal subcutaneous edema and relatively ill-defined fluid collection measuring up to 5.9 x 5.4 cm. Favor cellulitis and Phlegmon versus early abscess. 2. Left renal collecting system 7 mm nonobstructive calculus. 3. Aortic atherosclerosis ([id number]) Electronically signed by: Rockey Kilts MD 05/24/2024 10:43 AM EDT RP Workstation: [id number]   (Echo, Carotid, EGD, Colonoscopy, ERCP)    Subjective: Pt c/o fatigue    Discharge Exam: Vitals:   05/26/24 2142 05/27/24 0350  BP: (!) 103/59 127/75  Pulse: 77 84  Resp: 18 18  Temp: 98.7 F (37.1 C) 98.1 F (36.7 C)  SpO2: 100% 100%   Vitals:   05/26/24 0834 05/26/24 1633 05/26/24 2142 05/27/24 0350  BP: 106/70 120/65 (!)  103/59 127/75  Pulse: (!) 101 71 77 84  Resp: 18 18 18 18   Temp: 98.3 F (36.8 C) 97.8 F (36.6 C) 98.7 F (37.1 C) 98.1 F (36.7 C)  TempSrc: Oral  Oral   SpO2: 98% 100% 100% 100%  Weight:      Height:        General: Pt is alert, awake, not in acute distress Cardiovascular: S1/S2 +, no rubs, no gallops Respiratory: CTA bilaterally, no wheezing, no rhonchi Abdominal: Soft, NT, ND, bowel sounds + Extremities: no edema, no cyanosis    The results of significant diagnostics from this hospitalization (including imaging, microbiology, ancillary and laboratory) are listed below for reference.     Microbiology: Recent Results (from the past 240 hours)  Culture, blood (routine x 2)     Status: None (Preliminary result)   Collection Time: 05/24/24  9:27 AM   Specimen: BLOOD  Result Value Ref Range Status   Specimen Description BLOOD RIGHT WRIST  Final   Special Requests   Final    BOTTLES DRAWN AEROBIC AND ANAEROBIC Blood Culture results may not be optimal due to an inadequate volume of blood received in culture bottles   Culture   Final    NO GROWTH 3 DAYS Performed at [facility], [address] [place], [place] [address]    Report Status PENDING  Incomplete  Culture, blood (routine x 2)     Status: None (Preliminary result)   Collection Time: 05/24/24 11:18 AM   Specimen: BLOOD  Result Value Ref Range Status   Specimen Description BLOOD BLOOD LEFT FOREARM  Final   Special Requests   Final    BOTTLES DRAWN AEROBIC ONLY Blood Culture results may not be optimal due to an inadequate volume of blood received in culture bottles   Culture   Final    NO GROWTH 3 DAYS Performed at [facility], [address] [place], [place] [address]    Report Status PENDING  Incomplete  Aerobic/Anaerobic Culture w Gram Stain (surgical/deep wound)     Status: None (Preliminary result)   Collection Time: 05/25/24 12:09 PM   Specimen: Wound; Body Fluid  Result Value Ref  Range Status   Specimen Description   Final    WOUND LEFT GROIN ABSCESS Performed at [facility], [address] [place], [place] [address]    Special Requests UNCAPPED SYRINGE  Final   Gram Stain   Final    ABUNDANT WBC PRESENT, PREDOMINANTLY PMN FEW GRAM POSITIVE COCCI Performed at [facility], [address], [place], [place] [address]    Culture  Final    MODERATE STAPHYLOCOCCUS AUREUS CULTURE REINCUBATED FOR BETTER GROWTH SUSCEPTIBILITIES TO FOLLOW NO ANAEROBES ISOLATED; CULTURE IN PROGRESS FOR 5 DAYS    Report Status PENDING  Incomplete  Aerobic/Anaerobic Culture w Gram Stain (surgical/deep wound)     Status: None (Preliminary result)   Collection Time: 05/25/24 12:10 PM   Specimen: Wound; Abscess  Result Value Ref Range Status   Specimen Description   Final    WOUND LEFT GROIN ABSCESS Performed at [facility], [address] [place], [place] [address]    Special Requests   Final    NONE Performed at [facility], [address] [place], [place] [address]    Gram Stain   Final    FEW WBC PRESENT, PREDOMINANTLY PMN NO ORGANISMS SEEN Performed at [facility], [address], [place], [place] [address]    Culture   Final    FEW STAPHYLOCOCCUS AUREUS SUSCEPTIBILITIES TO FOLLOW NO ANAEROBES ISOLATED; CULTURE IN PROGRESS FOR 5 DAYS    Report Status PENDING  Incomplete     Labs: BNP (last 3 results) No results for input(s): BNP in the last 8760 hours. Basic Metabolic Panel: Recent Labs  Lab 05/24/24 0843 05/25/24 0443 05/26/24 1024 05/27/24 0520  NA 141 140 140 142  K 3.0* 3.0* 3.0* 3.5  CL 100 102 102 104  CO2 26 27 27 31   GLUCOSE 103* 86 98 98  BUN 15 9 11  7*  CREATININE 0.67 0.49 0.65 0.59  CALCIUM  9.2 8.7* 8.9 8.9   Liver Function Tests: No results for input(s): AST, ALT, ALKPHOS, BILITOT, PROT, ALBUMIN in the last 168 hours. No results for input(s): LIPASE, AMYLASE in the last 168  hours. No results for input(s): AMMONIA in the last 168 hours. CBC: Recent Labs  Lab 05/24/24 0843 05/25/24 0443 05/26/24 1024 05/27/24 0520  WBC 11.0*  11.1* 6.3 7.1 6.8  NEUTROABS 9.3*  --   --   --   HGB 13.3  13.4 11.0* 10.9* 11.2*  HCT 39.0  39.0 31.3* 31.7* 32.8*  MCV 97.0  97.3 95.4 95.8 96.8  PLT 263  251 205 217 214   Cardiac Enzymes: No results for input(s): CKTOTAL, CKMB, CKMBINDEX, TROPONINI in the last 168 hours. BNP: Invalid input(s): POCBNP CBG: No results for input(s): GLUCAP in the last 168 hours. D-Dimer No results for input(s): DDIMER in the last 72 hours. Hgb A1c No results for input(s): HGBA1C in the last 72 hours. Lipid Profile No results for input(s): CHOL, HDL, LDLCALC, TRIG, CHOLHDL, LDLDIRECT in the last 72 hours. Thyroid  function studies No results for input(s): TSH, T4TOTAL, T3FREE, THYROIDAB in the last 72 hours.  Invalid input(s): FREET3 Anemia work up Recent Labs    05/26/24 1819  VITAMINB12 537  FOLATE 8.8  IRON 33   Urinalysis    Component Value Date/Time   COLORURINE YELLOW (A) 11/01/2023 1135   APPEARANCEUR HAZY (A) 11/01/2023 1135   APPEARANCEUR Clear 10/06/2014 1009   LABSPEC 1.025 11/01/2023 1135   LABSPEC 1.008 10/06/2014 1009   PHURINE 6.0 11/01/2023 1135   GLUCOSEU NEGATIVE 11/01/2023 1135   GLUCOSEU Negative 10/06/2014 1009   HGBUR MODERATE (A) 11/01/2023 1135   BILIRUBINUR NEGATIVE 11/01/2023 1135   BILIRUBINUR Negative 10/06/2014 1009   KETONESUR 5 (A) 11/01/2023 1135   PROTEINUR NEGATIVE 11/01/2023 1135   NITRITE NEGATIVE 11/01/2023 1135   LEUKOCYTESUR NEGATIVE 11/01/2023 1135   LEUKOCYTESUR Negative 10/06/2014 1009   Sepsis Labs Recent Labs  Lab 05/24/24 [phone]  05/25/24 0443 05/26/24 1024 05/27/24 0520  WBC 11.0*  11.1* 6.3 7.1 6.8   Microbiology Recent Results (from the past 240 hours)  Culture, blood (routine x 2)     Status: None (Preliminary result)    Collection Time: 05/24/24  9:27 AM   Specimen: BLOOD  Result Value Ref Range Status   Specimen Description BLOOD RIGHT WRIST  Final   Special Requests   Final    BOTTLES DRAWN AEROBIC AND ANAEROBIC Blood Culture results may not be optimal due to an inadequate volume of blood received in culture bottles   Culture   Final    NO GROWTH 3 DAYS Performed at [facility], [address] [place], [place] [address]    Report Status PENDING  Incomplete  Culture, blood (routine x 2)     Status: None (Preliminary result)   Collection Time: 05/24/24 11:18 AM   Specimen: BLOOD  Result Value Ref Range Status   Specimen Description BLOOD BLOOD LEFT FOREARM  Final   Special Requests   Final    BOTTLES DRAWN AEROBIC ONLY Blood Culture results may not be optimal due to an inadequate volume of blood received in culture bottles   Culture   Final    NO GROWTH 3 DAYS Performed at [facility], [address] [place], [place] [address]    Report Status PENDING  Incomplete  Aerobic/Anaerobic Culture w Gram Stain (surgical/deep wound)     Status: None (Preliminary result)   Collection Time: 05/25/24 12:09 PM   Specimen: Wound; Body Fluid  Result Value Ref Range Status   Specimen Description   Final    WOUND LEFT GROIN ABSCESS Performed at [facility], [address] [place], [place] [address]    Special Requests UNCAPPED SYRINGE  Final   Gram Stain   Final    ABUNDANT WBC PRESENT, PREDOMINANTLY PMN FEW GRAM POSITIVE COCCI Performed at [facility], [address] [place], [place] [address]    Culture   Final    MODERATE STAPHYLOCOCCUS AUREUS CULTURE REINCUBATED FOR BETTER GROWTH SUSCEPTIBILITIES TO FOLLOW NO ANAEROBES ISOLATED; CULTURE IN PROGRESS FOR 5 DAYS    Report Status PENDING  Incomplete  Aerobic/Anaerobic Culture w Gram Stain (surgical/deep wound)     Status: None (Preliminary result)   Collection Time: 05/25/24 12:10 PM   Specimen: Wound; Abscess   Result Value Ref Range Status   Specimen Description   Final    WOUND LEFT GROIN ABSCESS Performed at [facility], [address] [place], [place] [address]    Special Requests   Final    NONE Performed at [facility], [address] [place], [place] [address]    Gram Stain   Final    FEW WBC PRESENT, PREDOMINANTLY PMN NO ORGANISMS SEEN Performed at [facility], [address], [place], [place] [address]    Culture   Final    FEW STAPHYLOCOCCUS AUREUS SUSCEPTIBILITIES TO FOLLOW NO ANAEROBES ISOLATED; CULTURE IN PROGRESS FOR 5 DAYS    Report Status PENDING  Incomplete     Time coordinating discharge: 36 minutes  SIGNED:   Anthony CHRISTELLA Pouch, MD  [facility] 05/27/2024, 2:09 PM Pager   If 7PM-7AM, please contact night-coverage [email or web address]

## 2024-05-27 NOTE — Consult Note (Addendum)
 WOC Nurse Consult Note: Consult requested by primary team for left I&D site dressing change and orders for discharge home.  Surgical PA at the bedside, states he will perform the dressing change and provide orders.  Please refer to their team for further questions regarding plan of care.   Please re-consult if further assistance is needed.  Thank-you,  Stephane Fought MSN, RN, CWOCN, CWCN-AP, CNS Contact Mon-Fri 0700-1500: [phone]

## 2024-05-27 NOTE — Progress Notes (Signed)
 [facility] SURGICAL PROGRESS NOTE  Hospital Day(s): 3.   Post op day(s): 2 Days Post-Op.   Interval History:  Patient seen and examined No acute events or new complaints overnight.  Patient reports she is still sore; pain medications helped No fever, chills No new labs this morning Cx from OR with Staph She continues on Rocephin , Linezolid    Vital signs in last 24 hours: [min-max] current  Temp:  [97.8 F (36.6 C)-98.7 F (37.1 C)] 98.1 F (36.7 C) (08/21 0350) Pulse Rate:  [71-84] 84 (08/21 0350) Resp:  [18] 18 (08/21 0350) BP: (103-127)/(59-75) 127/75 (08/21 0350) SpO2:  [100 %] 100 % (08/21 0350)     Height: 5' 3 (160 cm) Weight: 54.3 kg BMI (Calculated): 21.21   Intake/Output last 2 shifts:  08/20 0701 - 08/21 0700 In: 240 [P.O.:240] Out: -    Physical Exam:  Constitutional: alert, cooperative and no distress  Respiratory: breathing non-labored at rest  Cardiovascular: regular rate and sinus rhythm  Integumentary: Left inguinal wound with packing in place; removed, scant old blood in wound bed, this is expectedly tender, no evidence of residual abscess/necrosis. Packing replaced  Labs:     Latest Ref Rng & Units 05/27/2024    5:20 AM 05/26/2024   10:24 AM 05/25/2024    4:43 AM  CBC  WBC 4.0 - 10.5 K/uL 6.8  7.1  6.3   Hemoglobin 12.0 - 15.0 g/dL 88.7  89.0  88.9   Hematocrit 36.0 - 46.0 % 32.8  31.7  31.3   Platelets 150 - 400 K/uL 214  217  205       Latest Ref Rng & Units 05/27/2024    5:20 AM 05/26/2024   10:24 AM 05/25/2024    4:43 AM  CMP  Glucose 70 - 99 mg/dL 98  98  86   BUN 8 - 23 mg/dL 7  11  9    Creatinine 0.44 - 1.00 mg/dL 9.40  9.34  9.50   Sodium 135 - 145 mmol/L 142  140  140   Potassium 3.5 - 5.1 mmol/L 3.5  3.0  3.0   Chloride 98 - 111 mmol/L 104  102  102   CO2 22 - 32 mmol/L 31  27  27    Calcium  8.9 - 10.3 mg/dL 8.9  8.9  8.7     Imaging studies: No new pertinent imaging studies   Assessment/Plan: 63 y.o. female  2 Days Post-Op s/p incision and drainage of left inguinal abscess   - Continue local wound wound care; pack daily with Vase (okay to utilize normal saline as well) moistened Kerlix gauze, change as needed. Okay to remove to shower. She should continue this for 5 days post-op  - Continue Abx; agree with MRSA coverage given history of similar, follow up Cx - Pain control prn   - Mobilize as tolerated   - Further management per primary service  - Discharge Planning: Continue packing as described above. She will benefit from Abx for home; could treat for MRSA given history while Cx finalize. We will be happy to see her in~1 week for wound check   All of the above findings and recommendations were discussed with the patient, and the medical team, and all of patient's questions were answered to her expressed satisfaction.  -- Arthea Platt, PA-C [facility] 05/27/2024, 12:35 PM M-F: 7am - 4pm

## 2024-05-29 LAB — CULTURE, BLOOD (ROUTINE X 2)
Culture: NO GROWTH
Culture: NO GROWTH

## 2024-05-29 LAB — VITAMIN K1, SERUM: VITAMIN K1: 0.22 ng/mL (ref 0.10–2.20)

## 2024-05-29 LAB — VITAMIN B1: Vitamin B1 (Thiamine): 72.8 nmol/L (ref 66.5–200.0)

## 2024-05-30 LAB — AEROBIC/ANAEROBIC CULTURE W GRAM STAIN (SURGICAL/DEEP WOUND)

## 2024-05-31 LAB — VITAMIN E
Vitamin E (Alpha Tocopherol): 19.7 mg/L (ref 9.0–29.0)
Vitamin E(Gamma Tocopherol): 0.8 mg/L (ref 0.5–4.9)

## 2024-05-31 LAB — VITAMIN A: Vitamin A (Retinoic Acid): 21.9 ug/dL — ABNORMAL LOW (ref 22.0–69.5)

## 2024-05-31 LAB — COPPER, SERUM: Copper: 146 ug/dL (ref 80–158)

## 2024-05-31 LAB — ZINC: Zinc: 58 ug/dL (ref 44–115)

## 2024-06-02 ENCOUNTER — Ambulatory Visit (INDEPENDENT_AMBULATORY_CARE_PROVIDER_SITE_OTHER): Admitting: Physician Assistant

## 2024-06-02 ENCOUNTER — Encounter: Payer: Self-pay | Admitting: Physician Assistant

## 2024-06-02 VITALS — BP 108/67 | HR 66 | Temp 97.9°F | Ht 63.0 in | Wt 113.0 lb

## 2024-06-02 DIAGNOSIS — L03314 Cellulitis of groin: Secondary | ICD-10-CM

## 2024-06-02 DIAGNOSIS — L02214 Cutaneous abscess of groin: Secondary | ICD-10-CM

## 2024-06-02 DIAGNOSIS — Z09 Encounter for follow-up examination after completed treatment for conditions other than malignant neoplasm: Secondary | ICD-10-CM

## 2024-06-02 NOTE — Progress Notes (Signed)
 [facility] POST-OP OFFICE VISIT  06/02/2024  HPI: Vanessa Miller is a 63 y.o. female 8 days s/p incision and drainage of left inguinal abscess with Dr Marinda   She is doing well Minimal pain She continues to pack this and has been doing well at home No fever, chills Cx grew staph aureus, sensitivities reviewed, DC home on Linezolid  - has 2 doses remaining   Vital signs: BP 108/67   Pulse 66   Temp 97.9 F (36.6 C)   Ht 5' 3 (1.6 m)   Wt 113 lb (51.3 kg)   SpO2 99%   BMI 20.02 kg/m    Physical Exam: Constitutional: Well appearing female, NAD Skin: I&D site to the left groin, she is still packing this. Wound bed is healing and granulating expectedly, previous erythema resolved. Superior to this are two areas of induration which I suspect may be reactive lymph nodes.   Assessment/Plan: This is a 63 y.o. female 8 days s/p incision and drainage of left inguinal abscess with Dr Marinda    - She wishes to continue packing for now, she is doing well with this at home. She can continue to pack daily with saline moistened gauze, cover and secure. She was instructed to use less and less daily to prompt closure. No evidence of residual infection  - I will keep an eye on this area of induration superior to this. I suspect these are reactive lymph nodes however she does have a history of lymphoma in the the past.   - Complete Abx as prescribed.   - I will see her every ~2 weeks for wound checks; She understands to call with questions/concerns  -- Arthea Platt, PA-C [facility] 06/02/2024, 10:11 AM M-F: 7am - 4pm

## 2024-06-02 NOTE — Patient Instructions (Signed)
 Continue to pack the area daily with lightly moistened gauze and cover with dry gauze and tape in place.  Follow-up with our office in 2 weeks.  Please call and ask to speak with a nurse if you develop questions or concerns.   Wound Packing Wound packing usually involves placing a moistened packing material into your wound and then covering it with an outer bandage (dressing). This helps support the healing of deep tissue and tissue under the skin. It also helps prevent bleeding, infection, and further injury. Wounds are packed until deep tissues heal. The time it takes for this to happen is different for everyone. Your health care provider will show you how to pack and dress your wound. Using gloves and a clean technique is important to avoid spreading germs into your wound. Supplies needed: Soap and water. Disposable gloves. Cleansing or wetting solution, such as saline, germ-free (sterile) water, or an antiseptic solution. Clean bowl. Clean packing material, such as gauze, gauze sponges, or rolled gauze. Clean paper towels. Outer dressing. This includes the cover dressing and tape, or a dressing with an adhesive border. Cotton-tipped swabs. Small plastic bag for trash. How to pack your wound Follow your health care provider's instructions on how often you need to change dressings and pack your wound. You will likely be asked to change your dressings 1 to 2 times a day. Preparing to change the wound packing If needed, take pain medicine 30 minutes before you pack your wound as told by your health care provider. Preparing the new packing material  Clean and disinfect your work surface or countertop. Set a plastic bag on or near your work surface. Wash your hands with soap and water for at least 20 seconds before you change the dressing. If soap and water are not available, use hand sanitizer. Put a clean paper towel on the counter. Put a clean bowl on the towel. Only touch the outside  of the bowl when handling it. Pour the cleansing or wetting solution that your health care provider tells you to use into the bowl. Select and cut your packing material to fit the size of your wound. Avoid using multiple pieces of packing material. Drop it into the bowl. Cut tape strips that you will use to seal the outer dressing, if needed. Put gauze pads for cleansing and cotton-tipped swabs on the clean paper towel. Removing the old packing material and dressing Put on a set of gloves. Gently remove the old dressing and packing material. Make sure to check how the drainage looks or if there is any odor. Clean or rinse (irrigate) the wound. Remove your gloves. Put the removed items, including gloves, into the plastic bag to throw away later. Wash your hands again with soap and water for at least 20 seconds. If soap and water are not available, use hand sanitizer. Applying the new packing material and dressing  Put on a new set of gloves. Squeeze the packing material in the bowl to release the extra liquid. The packing material should be moist, but not dripping wet. Gently place the packing material into the wound. Use a cotton-tipped swab to guide it into place, filling all of the space. Do not overpack the wound bed. Dry your gloved fingertips on the paper towel. Open up your outer dressing supplies and put them on a dry part of the paper towel. Keep them from getting wet. Place the outer dressing over the packed wound. Tape the edges of the outer dressing in  place. Remove your gloves. Wash your hands again with soap and water for at least 20 seconds. If soap and water are not available, use hand sanitizer. Put the removed items, including gloves, into the plastic bag to throw away. Clean and disinfect your work surface or countertop. General tips Follow your health care provider's instructions on how much to pack the wound. At first, you may need to pack it more fully to help stop  bleeding. As the wound begins to heal inside, you will use less packing material and pack the wound loosely to allow the tissue to heal slowly from the inside out. Do not take baths, swim, or use a hot tub until your health care provider approves. Ask your health care provider if you may take showers. You may only be allowed to take sponge baths. Keep the dressing clean and dry. Follow any other instructions given by your health care provider on how to aid healing. This may include applying warm or cold compresses, raising (elevating) the affected area, or wearing a compression dressing. Check your wound site every day for signs of infection. Check for: More redness, swelling, or pain. More fluid or blood. Warmth or hardness (induration). Pus or a bad smell. Protect your wound from the sun when you are outside for the first 6 months, or for as long as told by your health care provider. Cover up the scar area or apply sunscreen that has an SPF of at least 30. Keep all follow-up visits. This is important. Contact a health care provider if: Your pain is not controlled with pain medicine. You have more drainage, redness, swelling, or pain at your wound site. You have new rash, warmth, or induration around the wound. You have a fever or chills. Your wound becomes larger or deeper. Get help right away if: The tissue inside your wound changes color from pink to white, yellow, or black. You notice a bad smell or pus coming from the wound site. You are having trouble packing your wound. Your wound is bleeding, and the bleeding does not stop with gentle pressure. These symptoms may represent a serious problem that is an emergency. Do not wait to see if the symptoms will go away. Get medical help right away. Call your local emergency services (911 in the [place] Do not drive yourself to the hospital. Summary Wound packing usually involves placing a moistened packing material into your wound and then  covering it with an outer bandage (dressing). Follow your health care provider's instructions on how often you need to change dressings and pack your wound. You will likely be asked to change dressings 1 to 2 times a day. When packing your wound, it is important to use gloves to avoid spreading germs into the wound. Check your wound site every day for signs of infection. This information is not intended to replace advice given to you by your health care provider. Make sure you discuss any questions you have with your health care provider. Document Revised: 01/30/2021 Document Reviewed: 01/30/2021 Elsevier Patient Education  2024 [employer].

## 2024-06-16 ENCOUNTER — Encounter: Admitting: Physician Assistant

## 2024-06-23 ENCOUNTER — Ambulatory Visit (INDEPENDENT_AMBULATORY_CARE_PROVIDER_SITE_OTHER): Admitting: Physician Assistant

## 2024-06-23 ENCOUNTER — Encounter: Payer: Self-pay | Admitting: Physician Assistant

## 2024-06-23 VITALS — BP 138/78 | HR 76 | Temp 98.3°F | Ht 63.0 in | Wt 115.0 lb

## 2024-06-23 DIAGNOSIS — L02214 Cutaneous abscess of groin: Secondary | ICD-10-CM

## 2024-06-23 DIAGNOSIS — Z09 Encounter for follow-up examination after completed treatment for conditions other than malignant neoplasm: Secondary | ICD-10-CM | POA: Diagnosis not present

## 2024-06-23 DIAGNOSIS — L03314 Cellulitis of groin: Secondary | ICD-10-CM

## 2024-06-23 NOTE — Progress Notes (Signed)
 [facility] POST-OP OFFICE VISIT  06/23/2024  HPI: Vanessa Miller is a 63 y.o. female ~1 month s/p incision and drainage of left inguinal abscess with Dr Marinda   She is doing well Wound is >90% closed without depth She is doing superficial dressings No drainage   Vital signs: BP 138/78   Pulse 76   Temp 98.3 F (36.8 C) (Oral)   Ht 5' 3 (1.6 m)   Wt 115 lb (52.2 kg)   SpO2 98%   BMI 20.37 kg/m    Physical Exam: Constitutional: Well appearing female, NAD Skin: Left groin wound >90% closed, there is a <1 cm opening centrally, granulating well, no erythema, no drainage   Assessment/Plan: This is a 63 y.o. female ~1 month s/p incision and drainage of left inguinal abscess with Dr Marinda    - Continue superficial dressings as needed until complete closure. She may continue to shower. No need for further Abx nor procedures  - She can follow up on as needed basis; She understands to call with questions/concerns  -- Arthea Platt, PA-C [facility] 06/23/2024, 3:07 PM M-F: 7am - 4pm

## 2024-06-23 NOTE — Patient Instructions (Signed)

## 2024-10-04 ENCOUNTER — Other Ambulatory Visit: Payer: Self-pay | Admitting: Radiology

## 2024-10-05 ENCOUNTER — Ambulatory Visit
Admission: RE | Admit: 2024-10-05 | Discharge: 2024-10-05 | Disposition: A | Discharge status: Home / Self Care | Source: Ambulatory Visit | Attending: Oncology | Admitting: Oncology

## 2024-10-05 ENCOUNTER — Ambulatory Visit

## 2024-10-05 DIAGNOSIS — C8298 Follicular lymphoma, unspecified, lymph nodes of multiple sites: Secondary | ICD-10-CM | POA: Insufficient documentation

## 2024-10-05 MED ORDER — IOHEXOL 300 MG/ML  SOLN
100.0000 mL | Freq: Once | INTRAMUSCULAR | Status: AC | PRN
  Administered 2024-10-05: 100 mL via INTRAVENOUS

## 2024-10-11 DIAGNOSIS — C8298 Follicular lymphoma, unspecified, lymph nodes of multiple sites: Secondary | ICD-10-CM

## 2024-10-12 ENCOUNTER — Ambulatory Visit
Admission: RE | Admit: 2024-10-12 | Discharge: 2024-10-12 | Disposition: A | Discharge status: Home / Self Care | Source: Ambulatory Visit | Attending: Oncology | Admitting: Oncology

## 2024-10-12 ENCOUNTER — Inpatient Hospital Stay: Admitting: Oncology

## 2024-10-12 ENCOUNTER — Encounter: Payer: Self-pay | Admitting: Oncology

## 2024-10-12 ENCOUNTER — Inpatient Hospital Stay: Attending: Oncology

## 2024-10-12 ENCOUNTER — Ambulatory Visit: Admission: RE | Admit: 2024-10-12 | Source: Ambulatory Visit

## 2024-10-12 VITALS — BP 121/89 | HR 65 | Temp 96.5°F | Resp 18 | Wt 110.3 lb

## 2024-10-12 DIAGNOSIS — R935 Abnormal findings on diagnostic imaging of other abdominal regions, including retroperitoneum: Secondary | ICD-10-CM | POA: Insufficient documentation

## 2024-10-12 DIAGNOSIS — R61 Generalized hyperhidrosis: Secondary | ICD-10-CM

## 2024-10-12 DIAGNOSIS — N200 Calculus of kidney: Secondary | ICD-10-CM | POA: Diagnosis not present

## 2024-10-12 DIAGNOSIS — R112 Nausea with vomiting, unspecified: Secondary | ICD-10-CM | POA: Diagnosis not present

## 2024-10-12 DIAGNOSIS — R9389 Abnormal findings on diagnostic imaging of other specified body structures: Secondary | ICD-10-CM | POA: Diagnosis not present

## 2024-10-12 DIAGNOSIS — C8298 Follicular lymphoma, unspecified, lymph nodes of multiple sites: Secondary | ICD-10-CM

## 2024-10-12 LAB — CBC WITH DIFFERENTIAL (CANCER CENTER ONLY)
Abs Immature Granulocytes: 0.02 K/uL (ref 0.00–0.07)
Basophils Absolute: 0.1 K/uL (ref 0.0–0.1)
Basophils Relative: 2 %
Eosinophils Absolute: 0.1 K/uL (ref 0.0–0.5)
Eosinophils Relative: 2 %
HCT: 38.7 % (ref 36.0–46.0)
Hemoglobin: 12.8 g/dL (ref 12.0–15.0)
Immature Granulocytes: 0 %
Lymphocytes Relative: 15 %
Lymphs Abs: 0.8 K/uL (ref 0.7–4.0)
MCH: 32.8 pg (ref 26.0–34.0)
MCHC: 33.1 g/dL (ref 30.0–36.0)
MCV: 99.2 fL (ref 80.0–100.0)
Monocytes Absolute: 0.4 K/uL (ref 0.1–1.0)
Monocytes Relative: 7 %
Neutro Abs: 3.8 K/uL (ref 1.7–7.7)
Neutrophils Relative %: 74 %
Platelet Count: 250 K/uL (ref 150–400)
RBC: 3.9 MIL/uL (ref 3.87–5.11)
RDW: 13.1 % (ref 11.5–15.5)
WBC Count: 5.1 K/uL (ref 4.0–10.5)
nRBC: 0 % (ref 0.0–0.2)

## 2024-10-12 LAB — LACTATE DEHYDROGENASE: LDH: 184 U/L (ref 105–235)

## 2024-10-12 LAB — CMP (CANCER CENTER ONLY)
ALT: 14 U/L (ref 0–44)
AST: 25 U/L (ref 15–41)
Albumin: 4.2 g/dL (ref 3.5–5.0)
Alkaline Phosphatase: 91 U/L (ref 38–126)
Anion gap: 9 (ref 5–15)
BUN: 7 mg/dL — ABNORMAL LOW (ref 8–23)
CO2: 29 mmol/L (ref 22–32)
Calcium: 9.4 mg/dL (ref 8.9–10.3)
Chloride: 102 mmol/L (ref 98–111)
Creatinine: 0.7 mg/dL (ref 0.44–1.00)
GFR, Estimated: >60 mL/min
Glucose, Bld: 90 mg/dL (ref 70–99)
Potassium: 3.9 mmol/L (ref 3.5–5.1)
Sodium: 140 mmol/L (ref 135–145)
Total Bilirubin: 0.3 mg/dL (ref 0.0–1.2)
Total Protein: 7.1 g/dL (ref 6.5–8.1)

## 2024-10-12 MED ORDER — IOHEXOL 350 MG/ML SOLN
100.0000 mL | Freq: Once | INTRAVENOUS | Status: AC | PRN
  Administered 2024-10-12: 100 mL via INTRAVENOUS

## 2024-10-12 NOTE — Assessment & Plan Note (Addendum)
 Complaining of some increasedGrade 1-2, Completed BR x 6 cycles on 09/12/2023.  -Posttreatment PET scan showed complete response.  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  Continue surveillance
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
## 2024-10-12 NOTE — Assessment & Plan Note (Signed)
 CT showed nonobstructing left kidney stone.  Interestingly, patient reports right flank pain. I will refer patient to establish care with urology for further evaluation.

## 2024-10-12 NOTE — Assessment & Plan Note (Signed)
 Questionable nonocclusive thrombus in the mesenteric vein.   Recommend further evaluation with CTA/CTV as recommended by radiology.

## 2024-10-12 NOTE — Assessment & Plan Note (Signed)
 Overall improved compared to symptoms prior to chemotherapy. Slightly worse compared to last visit.  CT scanning results were reassuring Observation

## 2024-10-12 NOTE — Assessment & Plan Note (Signed)
 New symptoms since Thanksgiving. May be related to abnormal CT findings.  Pending above workup.  Recommend antiemetics as needed.

## 2024-10-13 ENCOUNTER — Ambulatory Visit: Payer: Self-pay | Admitting: Oncology

## 2024-10-13 DIAGNOSIS — C8298 Follicular lymphoma, unspecified, lymph nodes of multiple sites: Secondary | ICD-10-CM

## 2024-10-14 NOTE — Telephone Encounter (Signed)
-----   Message from Zelphia Cap, MD sent at 10/13/2024  9:05 PM EST ----- Please let patient know that CT scan result is good. No concerns of blood clot in abdomen.  Follow up 6 months lab and CT chest abdomen pelvis w contrast prior to MD  Cbc cmp LDH

## 2024-10-14 NOTE — Telephone Encounter (Signed)
 Called and spoke to pt about CT scan results and follow up plan.   MD follow up appt given to pt. (04/20/25)  Lauren, please contact pt to set up Lab and CT CAP at least 1 week prior to MD in July. Pt aware of plan

## 2024-10-15 ENCOUNTER — Encounter: Payer: Self-pay | Admitting: Oncology

## 2024-10-15 ENCOUNTER — Telehealth: Payer: Self-pay | Admitting: Oncology

## 2024-10-15 ENCOUNTER — Other Ambulatory Visit: Payer: Self-pay

## 2024-10-15 NOTE — Telephone Encounter (Signed)
 Called pt to sched CT and lab - pt confirmed date/time/location/arrival time - pt requested appt reminder via mail - LH

## 2024-10-18 ENCOUNTER — Other Ambulatory Visit: Payer: Self-pay

## 2024-10-18 DIAGNOSIS — C8298 Follicular lymphoma, unspecified, lymph nodes of multiple sites: Secondary | ICD-10-CM

## 2024-10-18 NOTE — Assessment & Plan Note (Signed)
 Remote hx of known nephrolithiasis  - 6mm LLP nonobstructive stone (via CT Jan 2026)   Incidental left lower pole, nonobstructive kidney stone.  Based on her chart review, high likelihood this represents the same stone seen in 2020.
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
## 2024-10-21 ENCOUNTER — Ambulatory Visit: Admitting: Urology

## 2024-10-21 VITALS — BP 107/70 | HR 88 | Ht 63.0 in | Wt 111.0 lb

## 2024-10-21 DIAGNOSIS — R8290 Unspecified abnormal findings in urine: Secondary | ICD-10-CM | POA: Insufficient documentation

## 2024-10-21 DIAGNOSIS — N200 Calculus of kidney: Secondary | ICD-10-CM

## 2024-10-21 LAB — URINALYSIS, COMPLETE
Bilirubin, UA: NEGATIVE
Glucose, UA: NEGATIVE
Ketones, UA: NEGATIVE
Nitrite, UA: NEGATIVE
Specific Gravity, UA: 1.01 (ref 1.005–1.030)
Urobilinogen, Ur: 0.2 mg/dL (ref 0.2–1.0)
pH, UA: 6 (ref 5.0–7.5)

## 2024-10-21 LAB — MICROSCOPIC EXAMINATION

## 2024-10-21 NOTE — Assessment & Plan Note (Signed)
 Isolated pyuria on spot urinalysis  -Asymptomatic, denies acute UTI symptoms  Will not treat with antibiotics at this time.  I do not think related to her various symptoms.

## 2024-10-21 NOTE — Addendum Note (Signed)
 Addended byBETHA CORIE PLATER on: 10/21/2024 03:43 PM   Modules accepted: Orders

## 2025-04-13 ENCOUNTER — Other Ambulatory Visit

## 2025-04-13 ENCOUNTER — Inpatient Hospital Stay

## 2025-04-20 ENCOUNTER — Inpatient Hospital Stay: Admitting: Oncology
